# Patient Record
Sex: Male | Born: 1980 | Race: White | Hispanic: No | Marital: Single | Smoking: Former smoker
Health system: Southern US, Community
[De-identification: ages and names within clinical notes are randomized; demographics above are authoritative.]

## PROBLEM LIST (undated history)

## (undated) DIAGNOSIS — C801 Malignant (primary) neoplasm, unspecified: Secondary | ICD-10-CM

## (undated) HISTORY — PX: SURGERY SCROTAL / TESTICULAR: SUR1316

---

## 2008-12-02 ENCOUNTER — Emergency Department (HOSPITAL_BASED_OUTPATIENT_CLINIC_OR_DEPARTMENT_OTHER): Admission: EM | Admit: 2008-12-02 | Discharge: 2008-12-02 | Payer: Self-pay | Admitting: Emergency Medicine

## 2008-12-02 ENCOUNTER — Emergency Department (HOSPITAL_COMMUNITY): Admission: EM | Admit: 2008-12-02 | Discharge: 2008-12-02 | Payer: Self-pay | Admitting: Emergency Medicine

## 2008-12-04 ENCOUNTER — Ambulatory Visit (HOSPITAL_COMMUNITY): Admission: RE | Admit: 2008-12-04 | Discharge: 2008-12-04 | Payer: Self-pay | Admitting: Urology

## 2008-12-07 ENCOUNTER — Encounter: Payer: Self-pay | Admitting: Urology

## 2008-12-07 ENCOUNTER — Ambulatory Visit (HOSPITAL_COMMUNITY): Admission: RE | Admit: 2008-12-07 | Discharge: 2008-12-07 | Payer: Self-pay | Admitting: Urology

## 2008-12-12 ENCOUNTER — Ambulatory Visit (HOSPITAL_COMMUNITY): Admission: RE | Admit: 2008-12-12 | Discharge: 2008-12-12 | Payer: Self-pay | Admitting: Urology

## 2008-12-21 ENCOUNTER — Ambulatory Visit: Admission: RE | Admit: 2008-12-21 | Discharge: 2009-02-09 | Payer: Self-pay | Admitting: Radiation Oncology

## 2009-03-02 ENCOUNTER — Ambulatory Visit (HOSPITAL_COMMUNITY): Admission: RE | Admit: 2009-03-02 | Discharge: 2009-03-02 | Payer: Self-pay | Admitting: Urology

## 2010-02-13 ENCOUNTER — Ambulatory Visit (HOSPITAL_COMMUNITY)
Admission: RE | Admit: 2010-02-13 | Discharge: 2010-02-13 | Payer: Self-pay | Source: Home / Self Care | Attending: Urology | Admitting: Urology

## 2010-03-08 ENCOUNTER — Ambulatory Visit (HOSPITAL_COMMUNITY): Admission: RE | Admit: 2010-03-08 | Payer: Self-pay | Source: Home / Self Care | Admitting: Radiation Oncology

## 2010-04-03 ENCOUNTER — Encounter: Payer: Self-pay | Admitting: Radiation Oncology

## 2010-06-06 LAB — URINALYSIS, ROUTINE W REFLEX MICROSCOPIC
Bilirubin Urine: NEGATIVE
Ketones, ur: NEGATIVE mg/dL

## 2010-06-06 LAB — GC/CHLAMYDIA PROBE AMP, GENITAL
Chlamydia, DNA Probe: NEGATIVE
GC Probe Amp, Genital: NEGATIVE

## 2014-06-03 ENCOUNTER — Emergency Department (HOSPITAL_COMMUNITY): Payer: Self-pay

## 2014-06-03 ENCOUNTER — Encounter (HOSPITAL_COMMUNITY): Payer: Self-pay | Admitting: Nurse Practitioner

## 2014-06-03 ENCOUNTER — Emergency Department (HOSPITAL_COMMUNITY)
Admission: EM | Admit: 2014-06-03 | Discharge: 2014-06-03 | Disposition: A | Discharge status: Home / Self Care | Payer: Self-pay | Attending: Emergency Medicine | Admitting: Emergency Medicine

## 2014-06-03 DIAGNOSIS — T148XXA Other injury of unspecified body region, initial encounter: Secondary | ICD-10-CM

## 2014-06-03 DIAGNOSIS — Z720 Tobacco use: Secondary | ICD-10-CM | POA: Insufficient documentation

## 2014-06-03 DIAGNOSIS — W2201XA Walked into wall, initial encounter: Secondary | ICD-10-CM | POA: Insufficient documentation

## 2014-06-03 DIAGNOSIS — Z859 Personal history of malignant neoplasm, unspecified: Secondary | ICD-10-CM | POA: Insufficient documentation

## 2014-06-03 DIAGNOSIS — Y9389 Activity, other specified: Secondary | ICD-10-CM | POA: Insufficient documentation

## 2014-06-03 DIAGNOSIS — Y998 Other external cause status: Secondary | ICD-10-CM | POA: Insufficient documentation

## 2014-06-03 DIAGNOSIS — Y9289 Other specified places as the place of occurrence of the external cause: Secondary | ICD-10-CM | POA: Insufficient documentation

## 2014-06-03 DIAGNOSIS — S62142A Displaced fracture of body of hamate [unciform] bone, left wrist, initial encounter for closed fracture: Secondary | ICD-10-CM | POA: Insufficient documentation

## 2014-06-03 HISTORY — DX: Malignant (primary) neoplasm, unspecified: C80.1

## 2014-06-03 MED ORDER — HYDROCODONE-ACETAMINOPHEN 5-325 MG PO TABS
1.0000 | ORAL_TABLET | Freq: Once | ORAL | Status: AC
  Administered 2014-06-03: 1 via ORAL
  Filled 2014-06-03: qty 1

## 2014-06-03 MED ORDER — HYDROCODONE-ACETAMINOPHEN 5-325 MG PO TABS: 1.0000 | ORAL_TABLET | ORAL | Status: DC | PRN

## 2014-06-03 MED ORDER — IBUPROFEN 800 MG PO TABS: 800.0000 mg | ORAL_TABLET | Freq: Three times a day (TID) | ORAL | Status: DC | PRN

## 2014-06-03 NOTE — Discharge Instructions (Signed)
Read the information below.  Use the prescribed medication as directed.  Please discuss all new medications with your pharmacist.  Do not take additional tylenol while taking the prescribed pain medication to avoid overdose.  You may return to the Emergency Department at any time for worsening condition or any new symptoms that concern you.  If you develop uncontrolled pain, weakness or numbness of the extremity, severe discoloration of the skin, or you are unable to move your fingers, return to the ER for a recheck.      Wrist Fracture A wrist fracture is a break or crack in one of the bones of your wrist. Your wrist is made up of eight small bones at the palm of your hand (carpal bones) and two long bones that make up your forearm (radius and ulna).  CAUSES   A direct blow to the wrist.  Falling on an outstretched hand.  Trauma, such as a car accident or a fall. RISK FACTORS Risk factors for wrist fracture include:   Participating in contact and high-risk sports, such as skiing, biking, and ice skating.  Taking steroid medicines.  Smoking.  Being male.  Being Caucasian.  Drinking more than three alcoholic beverages per day.  Having low or lowered bone density (osteoporosis or osteopenia).  Age. Older adults have decreased bone density.  Women who have had menopause.  History of previous fractures. SIGNS AND SYMPTOMS Symptoms of wrist fractures include tenderness, bruising, and inflammation. Additionally, the wrist may hang in an odd position or appear deformed.  DIAGNOSIS Diagnosis may include:  Physical exam.  X-ray. TREATMENT Treatment depends on many factors, including the nature and location of the fracture, your age, and your activity level. Treatment for wrist fracture can be nonsurgical or surgical.  Nonsurgical Treatment A plaster cast or splint may be applied to your wrist if the bone is in a good position. If the fracture is not in good position, it may be  necessary for your health care provider to realign it before applying a splint or cast. Usually, a cast or splint will be worn for several weeks.  Surgical Treatment Sometimes the position of the bone is so far out of place that surgery is required to apply a device to hold it together as it heals. Depending on the fracture, there are a number of options for holding the bone in place while it heals, such as a cast and metal pins.  HOME CARE INSTRUCTIONS  Keep your injured wrist elevated and move your fingers as much as possible.  Do not put pressure on any part of your cast or splint. It may break.   Use a plastic bag to protect your cast or splint from water while bathing or showering. Do not lower your cast or splint into water.  Take medicines only as directed by your health care provider.  Keep your cast or splint clean and dry. If it becomes wet, damaged, or suddenly feels too tight, contact your health care provider right away.  Do not use any tobacco products including cigarettes, chewing tobacco, or electronic cigarettes. Tobacco can delay bone healing. If you need help quitting, ask your health care provider.  Keep all follow-up visits as directed by your health care provider. This is important.  Ask your health care provider if you should take supplements of calcium and vitamins C and D to promote bone healing. SEEK MEDICAL CARE IF:   Your cast or splint is damaged, breaks, or gets wet.  You  have a fever.  You have chills.  You have continued severe pain or more swelling than you did before the cast was put on. SEEK IMMEDIATE MEDICAL CARE IF:   Your hand or fingernails on the injured arm turn blue or gray, or feel cold or numb.  You have decreased feeling in the fingers of your injured arm. MAKE SURE YOU:  Understand these instructions.  Will watch your condition.  Will get help right away if you are not doing well or get worse. Document Released: 11/27/2004  Document Revised: 07/04/2013 Document Reviewed: 03/07/2011 [facility] Patient Information 2015 [place], [place]. This information is not intended to replace advice given to you by your health care provider. Make sure you discuss any questions you have with your health care provider.  Splint Care Splints protect and rest injuries. Splints can be made of plaster, fiberglass, or metal. They are used to treat broken bones, sprains, tendonitis, and other injuries. HOME CARE  Keep the injured area raised (elevated) while sitting or lying down. Keep the injured body part just above the level of the heart. This will decrease puffiness (swelling) and pain.  If an elastic bandage was used to hold the splint, it can be loosened. Only loosen it to make room for puffiness and to ease pain.  Keep the splint clean and dry.  Do not scratch the skin under the splint with sharp or pointed objects.  Follow up with your doctor as told. GET HELP RIGHT AWAY IF:   There is more pain or pressure around the injury.  There is numbness, tingling, or pain in the toes or fingers past the injury.  The fingers or toes become cold or blue.  The splint becomes too soft or breaks before the injury is healed. MAKE SURE YOU:   Understand these instructions. Hamate (Hook) Fracture The hamate is a bone in the hand that has the "hamate hook," a bony bump (prominence), that is vulnerable to injury. A hamate fracture is a complete or incomplete break of this bone. SYMPTOMS  Pain or soreness on the little finger side (ulnar) of the wrist, on the palm side, or sometimes on the back side. Pain, tenderness, swelling, and occasionally bruising around the fracture site, at the base of the wrist. Pain with gripping or swinging a golf club, bat, or racquet. Numbness and coldness in the hand. Swelling in the hand, causing pressure on the blood vessels or nerves. CAUSES  A hamate fracture is usually the result of direct hit (trauma)  to the base of the palm from a grasped object, such as the end of a golf club, baseball bat, or racquet. RISK INCREASES WITH: Sports in which a bat (baseball, cricket), club (golf), or racquet (tennis, racquetball) are used. History of bone or joint disease, including osteoporosis, or previous hand restraint. PREVENTION Maintain physical fitness: Hand and forearm strength. Flexibility and endurance. When playing risky sports, wear appropriately fitted and padded protective equipment, such as padded gloves, or use clubs, racquets, and bats with padded grips. Learn and use proper technique when hitting or swinging a bat, racquet, or club. If you had a previous injury, use tape or padding to protect your hand before playing contact or jumping sports. PROGNOSIS  Hamate fractures may heal with restraint. However, the function of blood vessels in the area causes hamate injuries to have a decreased ability to heal. Surgery may be performed to remove the broken piece, with a return to sports after 6 to 10 weeks.  RELATED COMPLICATIONS  Fracture does not heal (nonunion), common. Fracture heals in a poor position (malunion). Impaired blood supply to the fracture and bones. Chronic pain, stiffness, or swelling of the hand and wrist, especially with prolonged casting. Nerve injury to the hand, causing pain, numbness, and weakness or clumsiness in the hand and fingers. Excessive bleeding in the hand, causing pressure and injury to nerves and blood vessels (rare). Tearing (rupture) of the tendons that bend the wrist or fingers. Risks of surgery: infection, bleeding, injury to nerves (numbness, weakness), nonunion, malunion, arthritis, stiffness, and pain. TREATMENT Verne Grain that are in proper alignment (non-displacedfracture), treatment first involves ice, medicine, and elevation to reduce pain and inflammation. Restraint of the hand and wrist is advised for 6 to 8 weeks, to allow the bones to heal. If the  fractured bones are out of alignment (displaced), surgery is advised to either remove the broken piece or fix it with screws and pins. After surgery, the injury is restrained. After restraint (with or without surgery), stretching and strengthening exercises for the injured and weakened joint and muscles are needed. Exercises may be completed at home or with a therapist. Depending on the sport and the position played, a brace or splint may be advised at first, when returning to sports.  MEDICATION  If pain medicine is needed, nonsteroidal anti-inflammatory medicines (aspirin and ibuprofen), or other minor pain relievers (acetaminophen), are often advised. Do not take pain medicine for 7 days before surgery. Prescription pain relievers are usually prescribed only after surgery. Use only as directed and only as much as you need. COLD THERAPY  Cold treatment (icing) relieves pain and reduces inflammation. Cold treatment should be applied for 10 to 15 minutes every 2 to 3 hours, and immediately after activity that aggravates your symptoms. Use ice packs or an ice massage. SEEK MEDICAL CARE IF:  Pain, tenderness, or swelling gets worse, despite treatment. You experience pain, numbness, or coldness in the hand. Blue, gray, or dark color appears in the fingernails. Any of the following occur after surgery: fever, increased pain, swelling, redness, drainage of fluids, or bleeding in the affected area. New, unexplained symptoms develop. (Drugs used in treatment may produce side effects.) Document Released: 02/17/2005 Document Revised: 05/12/2011 Document Reviewed: 06/01/2008 [facility] Patient Information 2015 [place], [place]. This information is not intended to replace advice given to you by your health care provider. Make sure you discuss any questions you have with your health care provider.   Will watch this condition.  Will get help right away if you are not doing well or get worse. Document Released:  11/27/2007 Document Revised: 05/12/2011 Document Reviewed: 11/27/2007 [facility] Patient Information 2015 [place], [place]. This information is not intended to replace advice given to you by your health care provider. Make sure you discuss any questions you have with your health care provider.

## 2014-06-03 NOTE — Progress Notes (Signed)
Orthopedic Tech Progress Note Patient Details:  Joshua Gregory 1980/05/11 [id number]  Ortho Devices Type of Ortho Device: Ace wrap, Ulna gutter splint Ortho Device/Splint Location: lue Ortho Device/Splint Interventions: Application   Drevon Plog 06/03/2014, 2:35 PM

## 2014-06-03 NOTE — ED Provider Notes (Signed)
CSN: [id number]     Arrival date & time 06/03/14  1230 History  This chart is scribed for non-physician practitioner, Clayton Bibles, PA-C, working with Debby Freiberg, MD by Chester Holstein, ED Scribe.  This patient was seen in room TR04C/TR04C and the patient's care was started 1:34 PM.      Chief Complaint  Patient presents with  . Wrist Pain     Patient is a 34 y.o. male presenting with wrist pain. The history is provided by the patient. No language interpreter was used.  Wrist Pain   HPI Comments: Cristal Howatt is a 34 y.o. male who presents to the Emergency Department complaining of left hand / wrist pain with onset last night. Pt states he was playing around with friends and punched a wall (exterior house wall). Pt notes moving thumb and turning hand aggravates the pain. Pt notes associated swelling in forearm which has resolved. Pt took ibuprofen 800mg  and icing for relief. Pt is right handed. Pt is unsure of tetanus status. Pt denies any other injury, numbness, and tingling.    Past Medical History  Diagnosis Date  . Cancer    Past Surgical History  Procedure Laterality Date  . Surgery scrotal / testicular     History reviewed. No pertinent family history. History  Substance Use Topics  . Smoking status: Current Every Day Smoker    Types: Cigarettes  . Smokeless tobacco: Not on file  . Alcohol Use: Yes    Review of Systems  Constitutional: Negative for fever.  Musculoskeletal: Positive for myalgias and arthralgias. Negative for back pain, joint swelling and neck pain.  Skin: Positive for wound. Negative for color change.  Allergic/Immunologic: Negative for immunocompromised state.  Neurological: Negative for weakness and numbness.  Hematological: Does not bruise/bleed easily.  Psychiatric/Behavioral: Negative for self-injury (accidental).      Allergies  Shellfish allergy  Home Medications   Prior to Admission medications   Not on File   BP 134/88 mmHg   Pulse 79  Temp(Src) 98 F (36.7 C) (Oral)  Resp 18  SpO2 96% Physical Exam  Constitutional: He appears well-developed and well-nourished. No distress.  HENT:  Head: Normocephalic and atraumatic.  Neck: Neck supple.  Pulmonary/Chest: Effort normal.  Musculoskeletal:  Focal tenderness to radial aspect of wrist diffusely throughout dorsal aspect of hand mostly over 3rd metacarpal; sensation intact, distal pulses intact.     Neurological: He is alert.  Skin: He is not diaphoretic.  Nursing note and vitals reviewed.   ED Course  Procedures (including critical care time) DIAGNOSTIC STUDIES: Oxygen Saturation is 96% on room air, normal by my interpretation.    COORDINATION OF CARE: 1:38 PM Discussed treatment plan with patient at beside, the patient agrees with the plan and has no further questions at this time.   Labs Review Labs Reviewed - No data to display  Imaging Review Dg Wrist Complete Left  06/03/2014   CLINICAL DATA:  Punched a wall. Left wrist injury, pain, and swelling. Initial encounter.  EXAM: LEFT WRIST - COMPLETE 3+ VIEW  COMPARISON:  None.  FINDINGS: A tiny ossific density is seen along the dorsal aspect of the hamate on the oblique projection, suspicious for a tiny avulsion fracture fragment. No other fractures are identified and there is no evidence of dislocation. No other bone lesions or arthropathy identified.  IMPRESSION: Suspect tiny avulsion fracture fragment along the dorsal aspect of the hamate. Recommend clinical correlation for point tenderness at this site.   Electronically Signed  By: Earle Gell M.D.   On: 06/03/2014 13:45   Dg Hand Complete Left  06/03/2014   CLINICAL DATA:  Punched a wall last night while drinking. Pain and swelling on the posterior side of left hand.  EXAM: LEFT HAND - COMPLETE 3+ VIEW  COMPARISON:  None.  FINDINGS: Subtle deformity involving the fifth metacarpal head/ neck region. Suspected to represent remote trauma. No acute fracture or  dislocation.  IMPRESSION: No acute osseous abnormality. Suspect remote trauma involving the distal fifth metacarpal.   Electronically Signed   By: Abigail Miyamoto M.D.   On: 06/03/2014 13:45     EKG Interpretation None     1:37 PM Pt declines tetanus vaccination. MDM   Final diagnoses:  Avulsion fracture  Closed hamate fracture, left, initial encounter    Afebrile, nontoxic patient with injury to his left wrist while punching a wall.   Xray shows avulsion fracture of hamate.  Pt is tender over this area.  Neurovascularly intact.   D/C home with splint, norco, ibuprofen, hand surgery follow up Fredna Dow).  Discussed result, findings, treatment, and follow up  with patient.  Pt given return precautions.  Pt verbalizes understanding and agrees with plan.      I personally performed the services described in this documentation, which was scribed in my presence. The recorded information has been reviewed and is accurate.     Clayton Bibles, PA-C 06/03/14 1535  Debby Freiberg, MD 06/06/14 [id number]

## 2014-06-03 NOTE — ED Notes (Addendum)
He was horseplaying with friends last night and punched something. hes had L wrist and hand pain since. Bruising and swelling noted to L posterior hand. Cms intact. Pt took 800mg  ibuprofen this am with some relief

## 2017-02-19 IMAGING — CR DG HAND COMPLETE 3+V*L*
3 series · 3 of 3 positions shown · non-contrast
Comparison: None.

CLINICAL DATA: Punched a wall last night while drinking. Pain and
swelling on the posterior side of left hand.

EXAM:
LEFT HAND - COMPLETE 3+ VIEW

[hand pa]
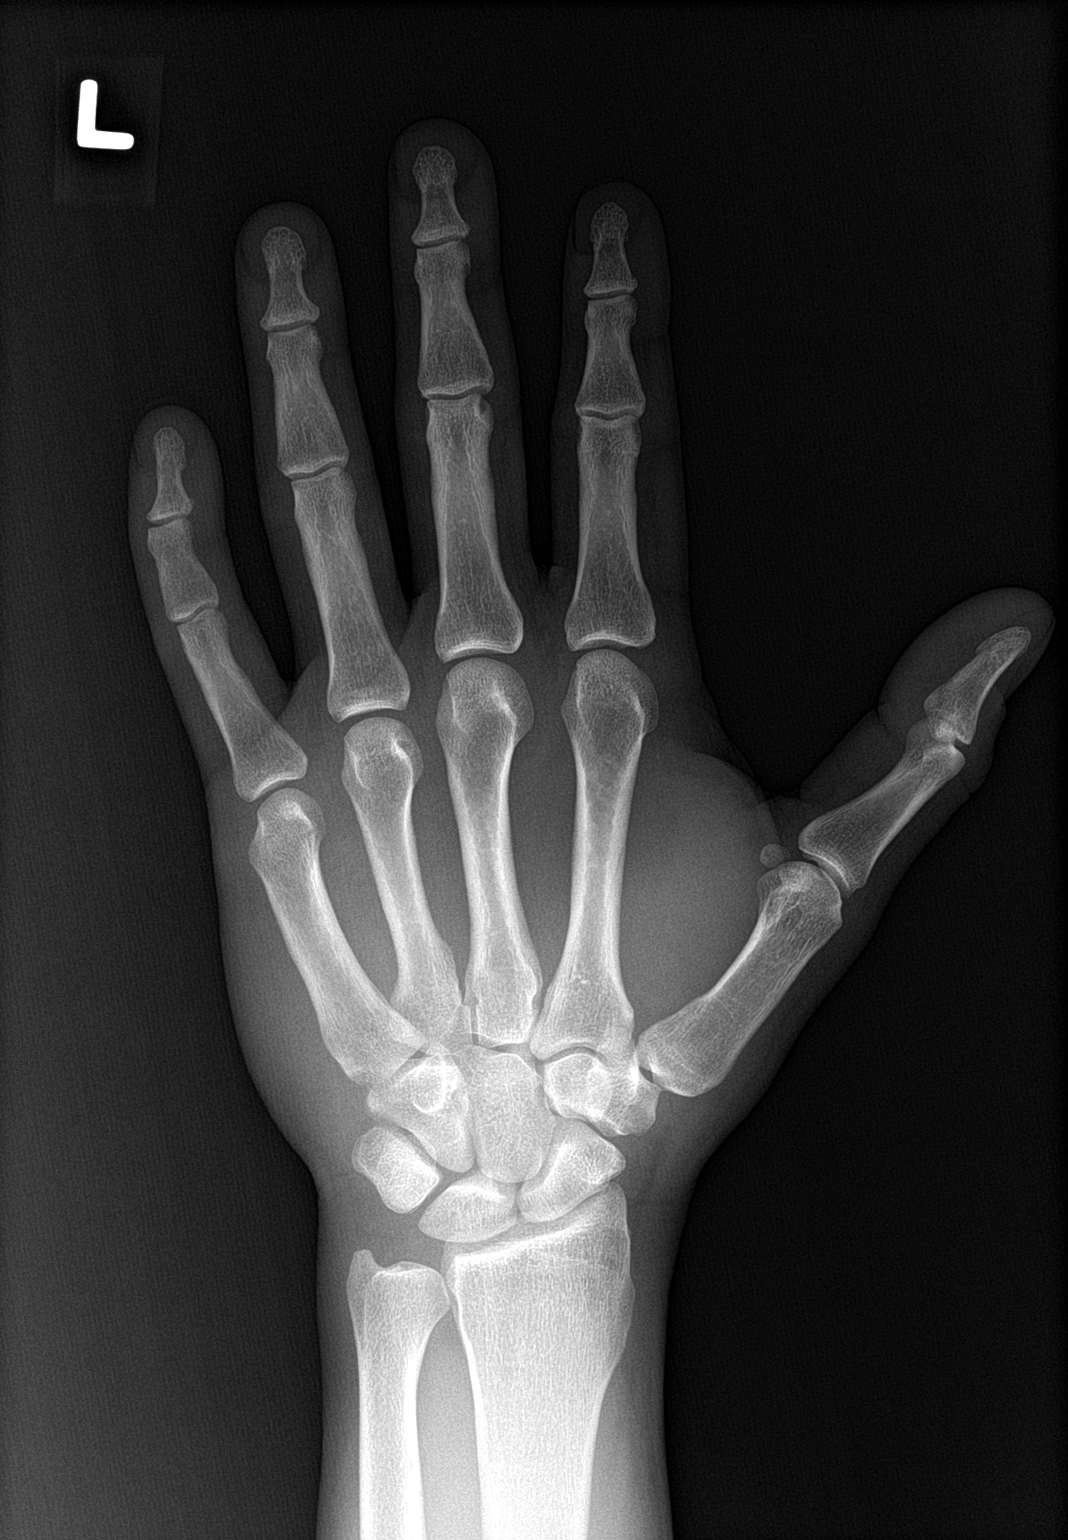

[hand obl]
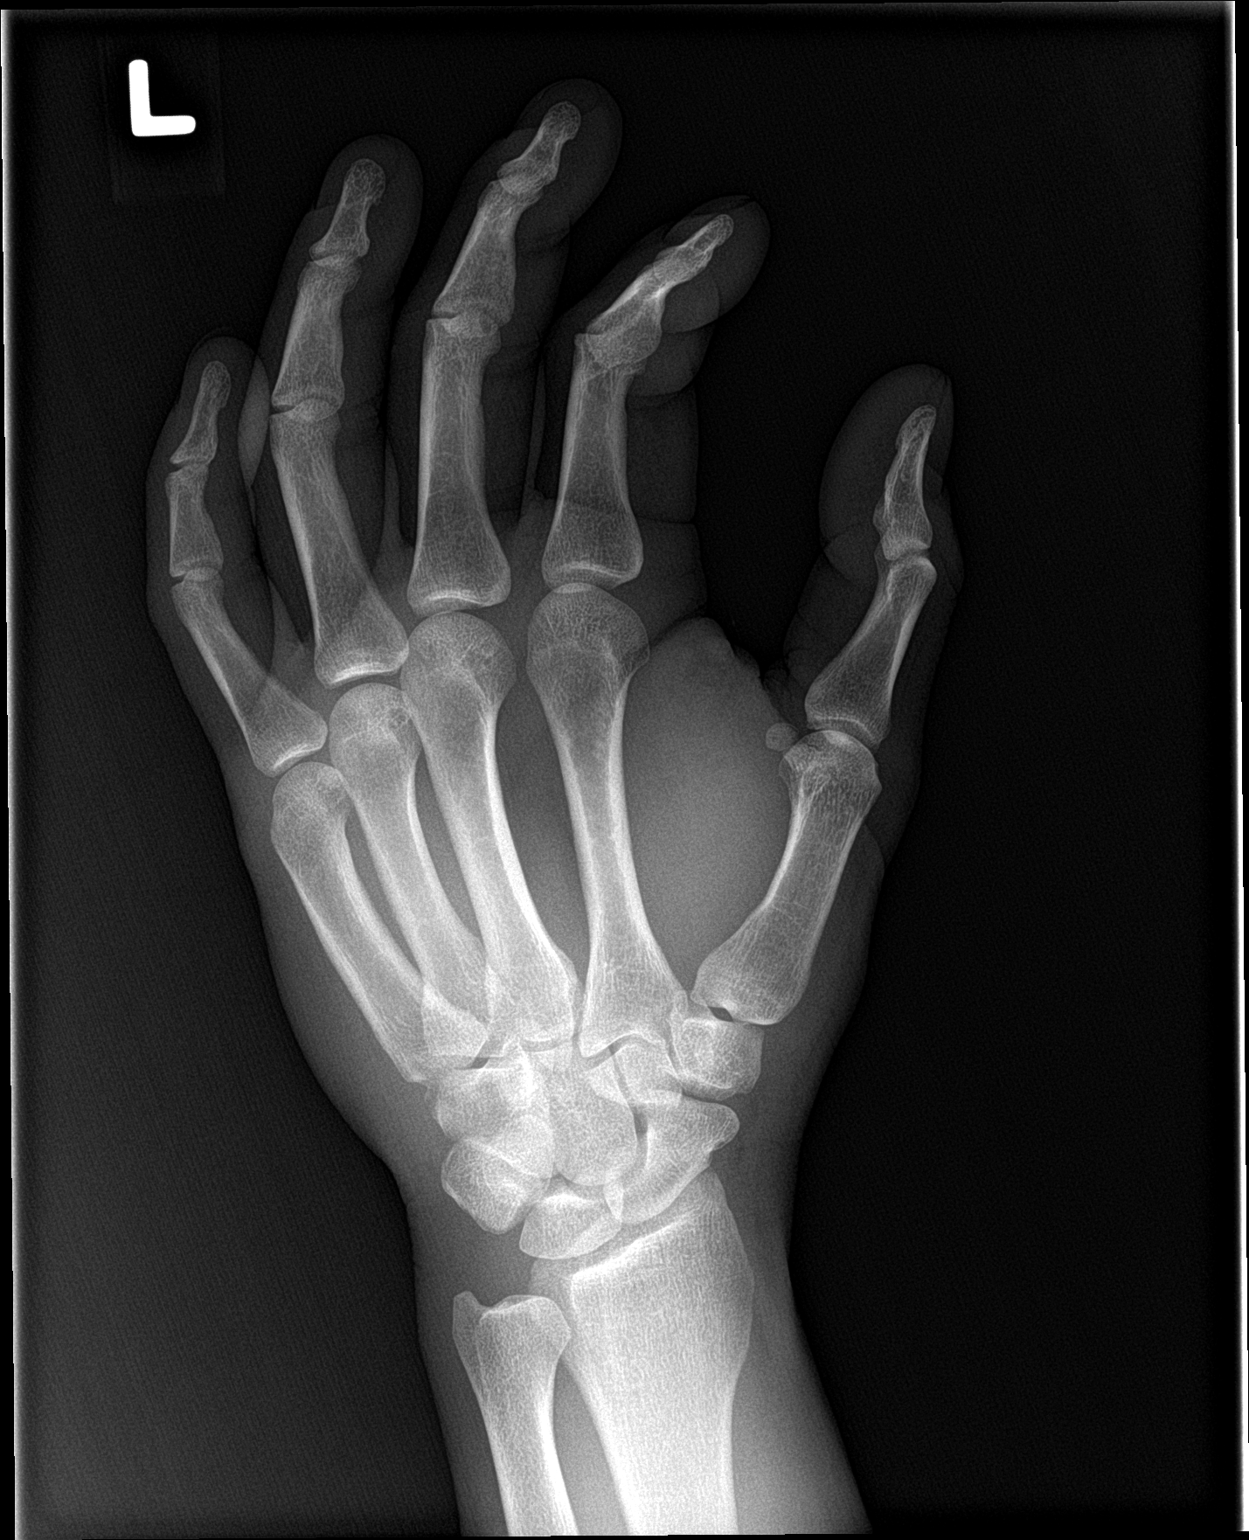

[hand lat]
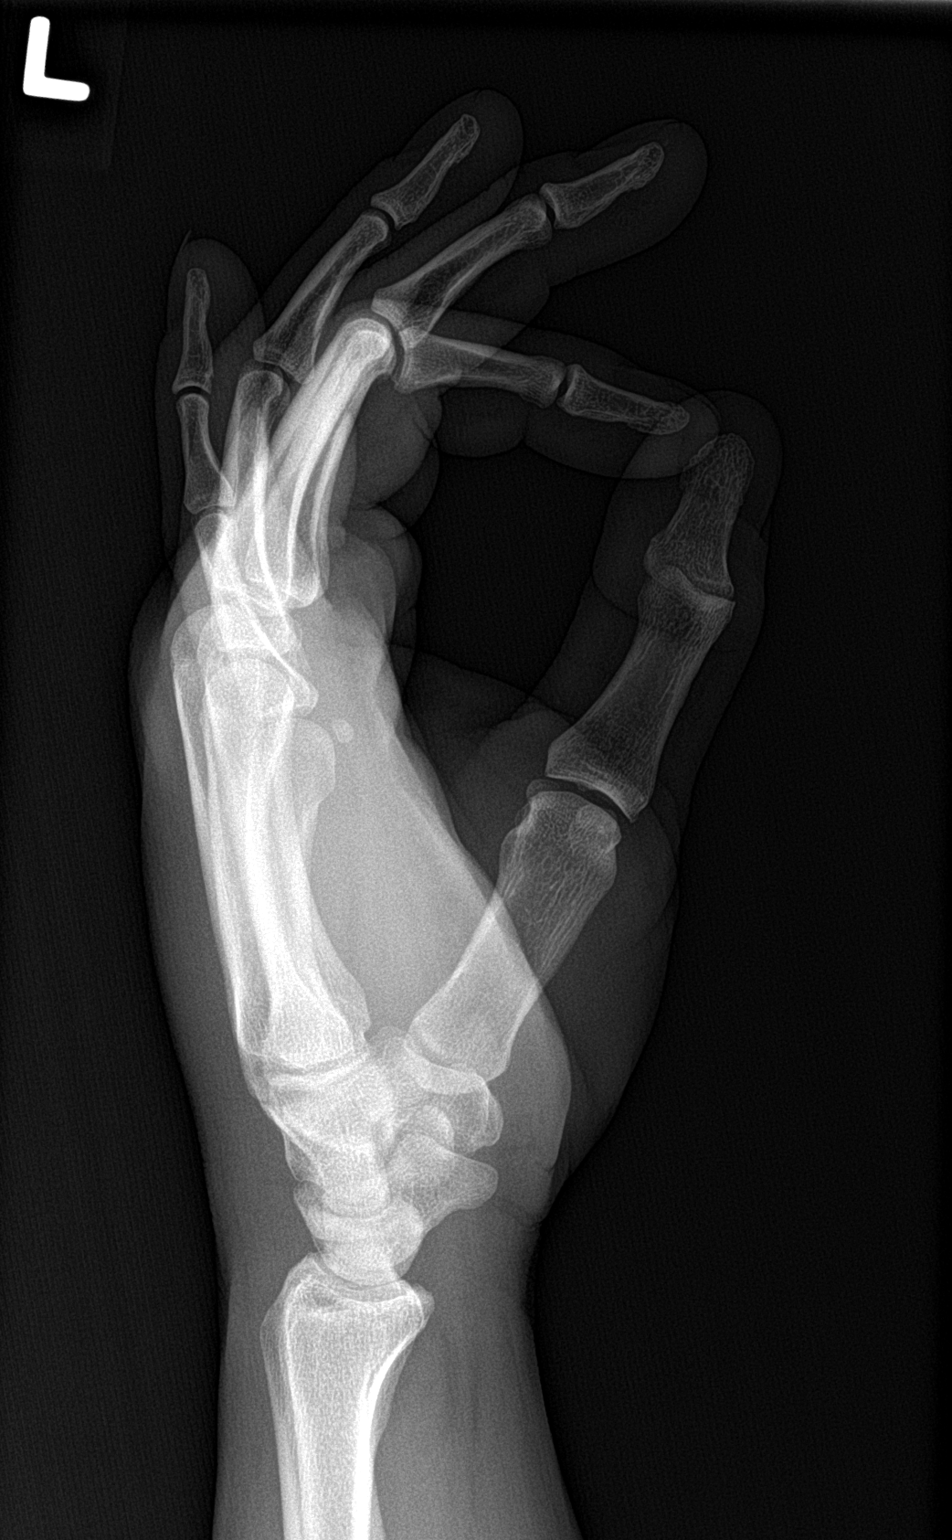

[3 of 3 positions shown; findings below may reference images not displayed]

FINDINGS: Subtle deformity involving the fifth metacarpal head/ neck region.
Suspected to represent remote trauma. No acute fracture or
dislocation.
IMPRESSION: No acute osseous abnormality. Suspect remote trauma involving the
distal fifth metacarpal.

## 2017-02-19 IMAGING — CR DG WRIST COMPLETE 3+V*L*
4 series · 4 of 4 positions shown · non-contrast
Comparison: None.

CLINICAL DATA: Punched a wall. Left wrist injury, pain, and
swelling. Initial encounter.

EXAM:
LEFT WRIST - COMPLETE 3+ VIEW

[wrist pa]
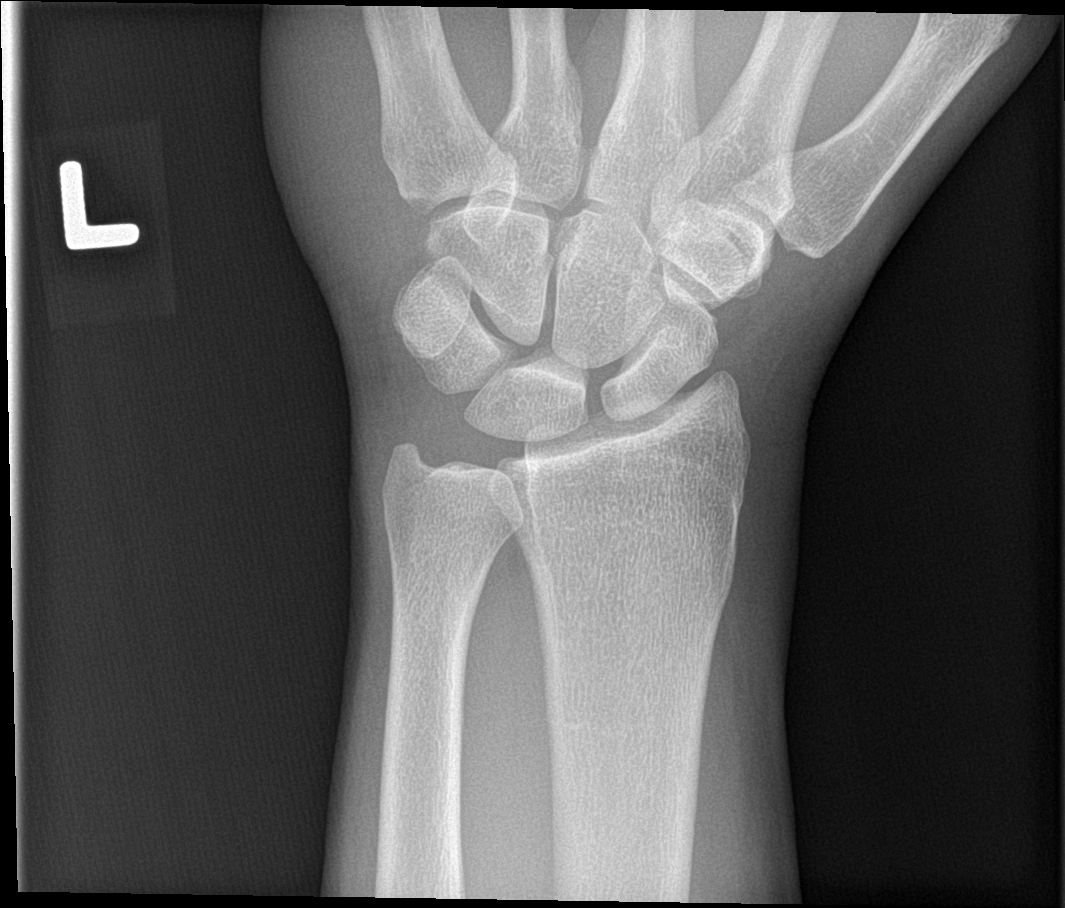

[wrist obl]
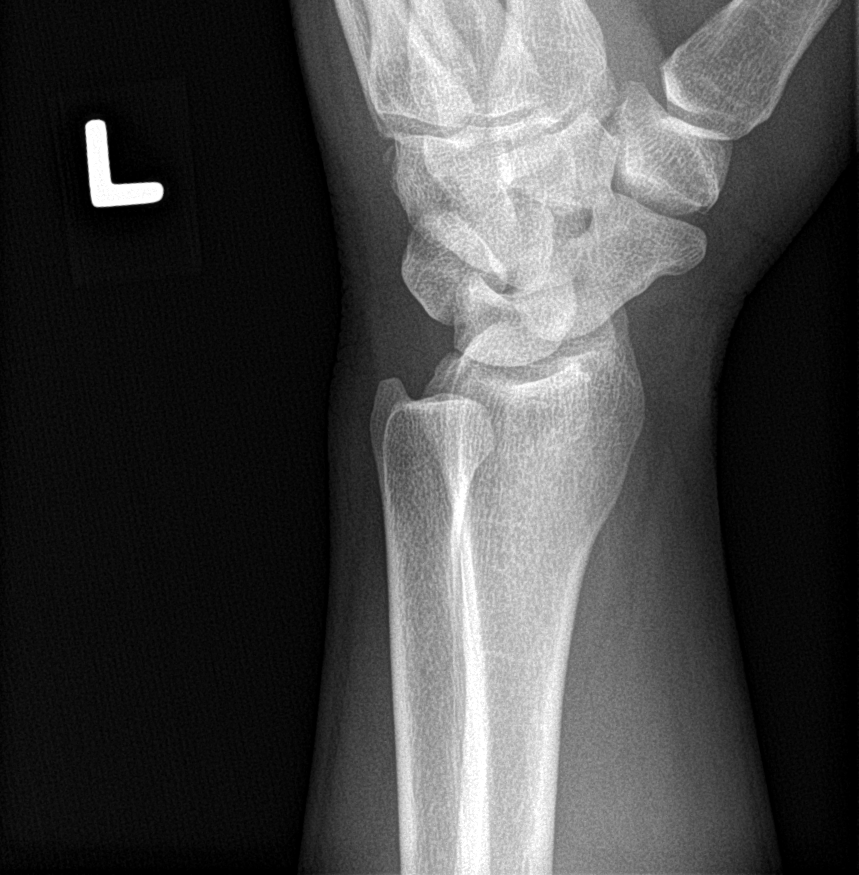

[wrist lat]
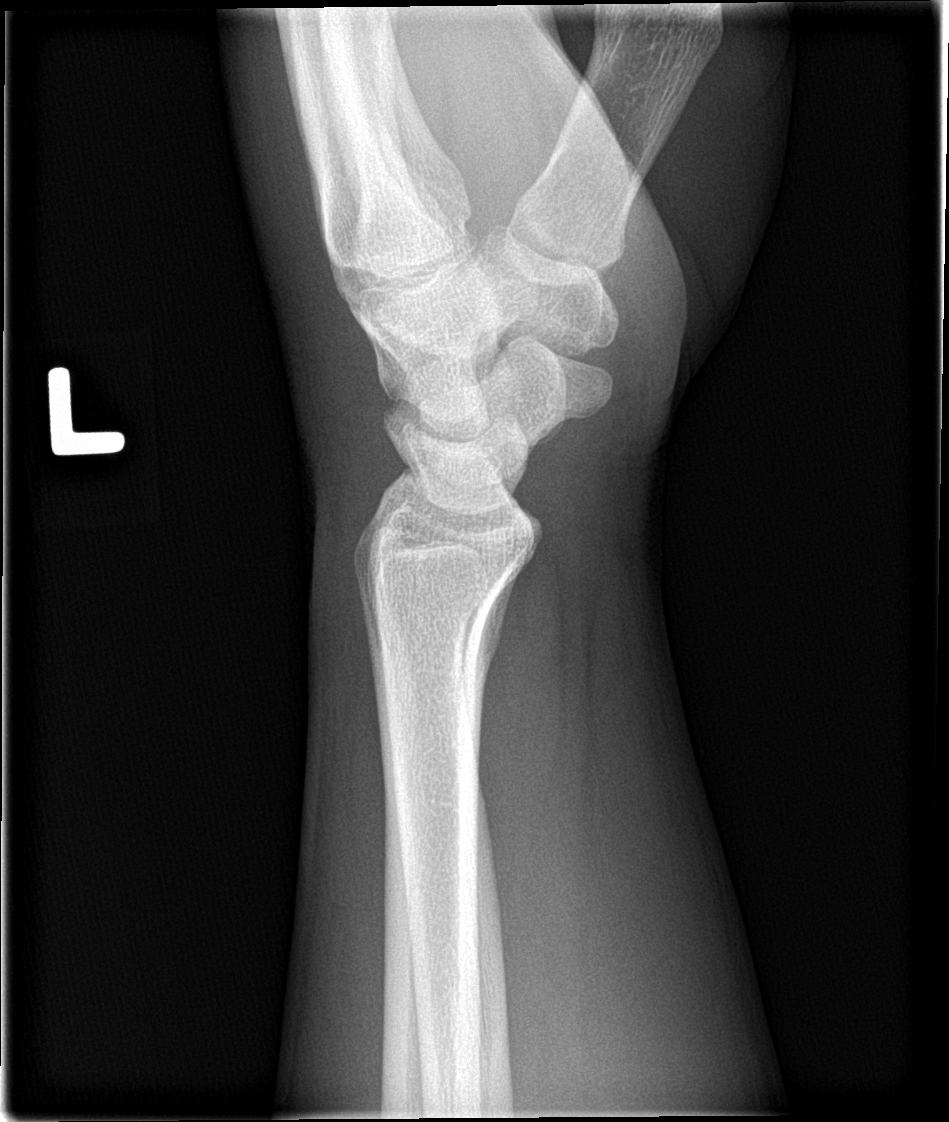

[wrist navicular]
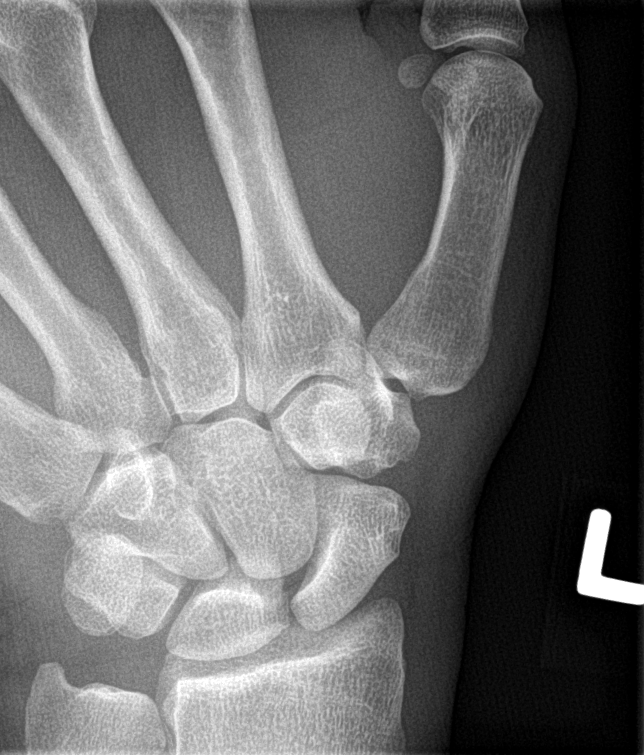

[4 of 4 positions shown; findings below may reference images not displayed]

FINDINGS: A tiny ossific density is seen along the dorsal aspect of the hamate
on the oblique projection, suspicious for a tiny avulsion fracture
fragment. No other fractures are identified and there is no evidence
of dislocation. No other bone lesions or arthropathy identified.
IMPRESSION: Suspect tiny avulsion fracture fragment along the dorsal aspect of
the hamate. Recommend clinical correlation for point tenderness at
this site.

## 2019-11-16 ENCOUNTER — Other Ambulatory Visit: Payer: Self-pay

## 2019-11-17 ENCOUNTER — Ambulatory Visit (INDEPENDENT_AMBULATORY_CARE_PROVIDER_SITE_OTHER): Payer: Managed Care, Other (non HMO) | Admitting: Family Medicine

## 2019-11-17 ENCOUNTER — Encounter: Payer: Self-pay | Admitting: Family Medicine

## 2019-11-17 VITALS — BP 132/78 | HR 58 | Temp 97.6°F | Ht 66.0 in | Wt 164.6 lb

## 2019-11-17 DIAGNOSIS — R5383 Other fatigue: Secondary | ICD-10-CM

## 2019-11-17 DIAGNOSIS — F341 Dysthymic disorder: Secondary | ICD-10-CM

## 2019-11-17 DIAGNOSIS — Z0000 Encounter for general adult medical examination without abnormal findings: Secondary | ICD-10-CM | POA: Diagnosis not present

## 2019-11-17 DIAGNOSIS — Z8547 Personal history of malignant neoplasm of testis: Secondary | ICD-10-CM

## 2019-11-17 LAB — COMPREHENSIVE METABOLIC PANEL
ALT: 26 U/L (ref 0–53)
AST: 19 U/L (ref 0–37)
Albumin: 4.7 g/dL (ref 3.5–5.2)
Alkaline Phosphatase: 129 U/L — ABNORMAL HIGH (ref 39–117)
BUN: 14 mg/dL (ref 6–23)
CO2: 29 mEq/L (ref 19–32)
Calcium: 10.1 mg/dL (ref 8.4–10.5)
Chloride: 100 mEq/L (ref 96–112)
Creatinine, Ser: 1.27 mg/dL (ref 0.40–1.50)
GFR: 63 mL/min (ref >60.00)
Glucose, Bld: 87 mg/dL (ref 70–99)
Potassium: 5.1 mEq/L (ref 3.5–5.1)
Sodium: 136 mEq/L (ref 135–145)
Total Bilirubin: 0.7 mg/dL (ref 0.2–1.2)
Total Protein: 7.4 g/dL (ref 6.0–8.3)

## 2019-11-17 LAB — LIPID PANEL
Cholesterol: 198 mg/dL (ref 0–200)
HDL: 42.8 mg/dL (ref >39.00)
NonHDL: 155.33
Total CHOL/HDL Ratio: 5
Triglycerides: 246 mg/dL — ABNORMAL HIGH (ref 0.0–149.0)
VLDL: 49.2 mg/dL — ABNORMAL HIGH (ref 0.0–40.0)

## 2019-11-17 LAB — CBC
HCT: 46.8 % (ref 39.0–52.0)
Hemoglobin: 15.8 g/dL (ref 13.0–17.0)
MCHC: 33.7 g/dL (ref 30.0–36.0)
MCV: 86.8 fl (ref 78.0–100.0)
Platelets: 239 10*3/uL (ref 150.0–400.0)
RBC: 5.39 Mil/uL (ref 4.22–5.81)
RDW: 14.3 % (ref 11.5–15.5)
WBC: 8.4 10*3/uL (ref 4.0–10.5)

## 2019-11-17 LAB — URINALYSIS, ROUTINE W REFLEX MICROSCOPIC
Bilirubin Urine: NEGATIVE
Hgb urine dipstick: NEGATIVE
Ketones, ur: NEGATIVE
Leukocytes,Ua: NEGATIVE
Nitrite: NEGATIVE
RBC / HPF: NONE SEEN (ref 0–?)
Specific Gravity, Urine: 1.015 (ref 1.000–1.030)
Total Protein, Urine: NEGATIVE
Urine Glucose: NEGATIVE
Urobilinogen, UA: 0.2 (ref 0.0–1.0)
WBC, UA: NONE SEEN (ref 0–?)
pH: 6 (ref 5.0–8.0)

## 2019-11-17 LAB — LDL CHOLESTEROL, DIRECT: Direct LDL: 77 mg/dL

## 2019-11-17 LAB — TESTOSTERONE: Testosterone: 129.53 ng/dL — ABNORMAL LOW (ref 300.00–890.00)

## 2019-11-17 LAB — TSH: TSH: 0.88 u[IU]/mL (ref 0.35–4.50)

## 2019-11-17 NOTE — Patient Instructions (Signed)
Fatigue If you have fatigue, you feel tired all the time and have a lack of energy or a lack of motivation. Fatigue may make it difficult to start or complete tasks because of exhaustion. In general, occasional or mild fatigue is often a normal response to activity or life. However, long-lasting (chronic) or extreme fatigue may be a symptom of a medical condition. Follow these instructions at home: General instructions  Watch your fatigue for any changes.  Go to bed and get up at the same time every day.  Avoid fatigue by pacing yourself during the day and getting enough sleep at night.  Maintain a healthy weight. Medicines  Take over-the-counter and prescription medicines only as told by your health care provider.  Take a multivitamin, if told by your health care provider.  Do not use herbal or dietary supplements unless they are approved by your health care provider. Activity   Exercise regularly, as told by your health care provider.  Use or practice techniques to help you relax, such as yoga, tai chi, meditation, or massage therapy. Eating and drinking   Avoid heavy meals in the evening.  Eat a well-balanced diet, which includes lean proteins, whole grains, plenty of fruits and vegetables, and low-fat dairy products.  Avoid consuming too much caffeine.  Avoid the use of alcohol.  Drink enough fluid to keep your urine pale yellow. Lifestyle  Change situations that cause you stress. Try to keep your work and personal schedule in balance.  Do not use any products that contain nicotine or tobacco, such as cigarettes and e-cigarettes. If you need help quitting, ask your health care provider.  Do not use drugs. Contact a health care provider if:  Your fatigue does not get better.  You have a fever.  You suddenly lose or gain weight.  You have headaches.  You have trouble falling asleep or sleeping through the night.  You feel angry, guilty, anxious, or  sad.  You are unable to have a bowel movement (constipation).  Your skin is dry.  You have swelling in your legs or another part of your body. Get help right away if:  You feel confused.  Your vision is blurry.  You feel faint or you pass out.  You have a severe headache.  You have severe pain in your abdomen, your back, or the area between your waist and hips (pelvis).  You have chest pain, shortness of breath, or an irregular or fast heartbeat.  You are unable to urinate, or you urinate less than normal.  You have abnormal bleeding, such as bleeding from the rectum, vagina, nose, lungs, or nipples.  You vomit blood.  You have thoughts about hurting yourself or others. If you ever feel like you may hurt yourself or others, or have thoughts about taking your own life, get help right away. You can go to your nearest emergency department or call:  Your local emergency services (911 in the [place]  A suicide crisis helpline, such as the [facility] at 9164775271. This is open 24 hours a day. Summary  If you have fatigue, you feel tired all the time and have a lack of energy or a lack of motivation.  Fatigue may make it difficult to start or complete tasks because of exhaustion.  Long-lasting (chronic) or extreme fatigue may be a symptom of a medical condition.  Exercise regularly, as told by your health care provider.  Change situations that cause you stress. Try to keep your  work and personal schedule in balance. This information is not intended to replace advice given to you by your health care provider. Make sure you discuss any questions you have with your health care provider. Document Revised: 09/08/2018 Document Reviewed: 11/12/2016 Elsevier Patient Education  2020 [place] 2-81 Years Old, Male Preventive care refers to lifestyle choices and visits with your health care provider that can promote health and  wellness. This includes:  A yearly physical exam. This is also called an annual well check.  Regular dental and eye exams.  Immunizations.  Screening for certain conditions.  Healthy lifestyle choices, such as eating a healthy diet, getting regular exercise, not using drugs or products that contain nicotine and tobacco, and limiting alcohol use. What can I expect for my preventive care visit? Physical exam Your health care provider will check:  Height and weight. These may be used to calculate body mass index (BMI), which is a measurement that tells if you are at a healthy weight.  Heart rate and blood pressure.  Your skin for abnormal spots. Counseling Your health care provider may ask you questions about:  Alcohol, tobacco, and drug use.  Emotional well-being.  Home and relationship well-being.  Sexual activity.  Eating habits.  Work and work Statistician. What immunizations do I need?  Influenza (flu) vaccine  This is recommended every year. Tetanus, diphtheria, and pertussis (Tdap) vaccine  You may need a Td booster every 10 years. Varicella (chickenpox) vaccine  You may need this vaccine if you have not already been vaccinated. Human papillomavirus (HPV) vaccine  If recommended by your health care provider, you may need three doses over 6 months. Measles, mumps, and rubella (MMR) vaccine  You may need at least one dose of MMR. You may also need a second dose. Meningococcal conjugate (MenACWY) vaccine  One dose is recommended if you are 77-29 years old and a Market researcher living in a residence hall, or if you have one of several medical conditions. You may also need additional booster doses. Pneumococcal conjugate (PCV13) vaccine  You may need this if you have certain conditions and were not previously vaccinated. Pneumococcal polysaccharide (PPSV23) vaccine  You may need one or two doses if you smoke cigarettes or if you have certain  conditions. Hepatitis A vaccine  You may need this if you have certain conditions or if you travel or work in places where you may be exposed to hepatitis A. Hepatitis B vaccine  You may need this if you have certain conditions or if you travel or work in places where you may be exposed to hepatitis B. Haemophilus influenzae type b (Hib) vaccine  You may need this if you have certain risk factors. You may receive vaccines as individual doses or as more than one vaccine together in one shot (combination vaccines). Talk with your health care provider about the risks and benefits of combination vaccines. What tests do I need? Blood tests  Lipid and cholesterol levels. These may be checked every 5 years starting at age 53.  Hepatitis C test.  Hepatitis B test. Screening   Diabetes screening. This is done by checking your blood sugar (glucose) after you have not eaten for a while (fasting).  Sexually transmitted disease (STD) testing. Talk with your health care provider about your test results, treatment options, and if necessary, the need for more tests. Follow these instructions at home: Eating and drinking   Eat a diet that includes fresh fruits and  vegetables, whole grains, lean protein, and low-fat dairy products.  Take vitamin and mineral supplements as recommended by your health care provider.  Do not drink alcohol if your health care provider tells you not to drink.  If you drink alcohol: ? Limit how much you have to 0-2 drinks a day. ? Be aware of how much alcohol is in your drink. In the [place] one drink equals one 12 oz bottle of beer (355 mL), one 5 oz glass of wine (148 mL), or one 1 oz glass of hard liquor (44 mL). Lifestyle  Take daily care of your teeth and gums.  Stay active. Exercise for at least 30 minutes on 5 or more days each week.  Do not use any products that contain nicotine or tobacco, such as cigarettes, e-cigarettes, and chewing tobacco. If you need  help quitting, ask your health care provider.  If you are sexually active, practice safe sex. Use a condom or other form of protection to prevent STIs (sexually transmitted infections). What's next?  Go to your health care provider once a year for a well check visit.  Ask your health care provider how often you should have your eyes and teeth checked.  Stay up to date on all vaccines. This information is not intended to replace advice given to you by your health care provider. Make sure you discuss any questions you have with your health care provider. Document Revised: 02/11/2018 Document Reviewed: 02/11/2018 Elsevier Patient Education  2020 [place] Maintenance, Male Adopting a healthy lifestyle and getting preventive care are important in promoting health and wellness. Ask your health care provider about:  The right schedule for you to have regular tests and exams.  Things you can do on your own to prevent diseases and keep yourself healthy. What should I know about diet, weight, and exercise? Eat a healthy diet   Eat a diet that includes plenty of vegetables, fruits, low-fat dairy products, and lean protein.  Do not eat a lot of foods that are high in solid fats, added sugars, or sodium. Maintain a healthy weight Body mass index (BMI) is a measurement that can be used to identify possible weight problems. It estimates body fat based on height and weight. Your health care provider can help determine your BMI and help you achieve or maintain a healthy weight. Get regular exercise Get regular exercise. This is one of the most important things you can do for your health. Most adults should:  Exercise for at least 150 minutes each week. The exercise should increase your heart rate and make you sweat (moderate-intensity exercise).  Do strengthening exercises at least twice a week. This is in addition to the moderate-intensity exercise.  Spend less time sitting. Even  light physical activity can be beneficial. Watch cholesterol and blood lipids Have your blood tested for lipids and cholesterol at 39 years of age, then have this test every 5 years. You may need to have your cholesterol levels checked more often if:  Your lipid or cholesterol levels are high.  You are older than 39 years of age.  You are at high risk for heart disease. What should I know about cancer screening? Many types of cancers can be detected early and may often be prevented. Depending on your health history and family history, you may need to have cancer screening at various ages. This may include screening for:  Colorectal cancer.  Prostate cancer.  Skin cancer.  Lung cancer. What should I  know about heart disease, diabetes, and high blood pressure? Blood pressure and heart disease  High blood pressure causes heart disease and increases the risk of stroke. This is more likely to develop in people who have high blood pressure readings, are of African descent, or are overweight.  Talk with your health care provider about your target blood pressure readings.  Have your blood pressure checked: ? Every 3-5 years if you are 21-95 years of age. ? Every year if you are 45 years old or older.  If you are between the ages of 59 and 35 and are a current or former smoker, ask your health care provider if you should have a one-time screening for abdominal aortic aneurysm (AAA). Diabetes Have regular diabetes screenings. This checks your fasting blood sugar level. Have the screening done:  Once every three years after age 44 if you are at a normal weight and have a low risk for diabetes.  More often and at a younger age if you are overweight or have a high risk for diabetes. What should I know about preventing infection? Hepatitis B If you have a higher risk for hepatitis B, you should be screened for this virus. Talk with your health care provider to find out if you are at risk  for hepatitis B infection. Hepatitis C Blood testing is recommended for:  Everyone born from [birth year] through 1965.  Anyone with known risk factors for hepatitis C. Sexually transmitted infections (STIs)  You should be screened each year for STIs, including gonorrhea and chlamydia, if: ? You are sexually active and are younger than 39 years of age. ? You are older than 39 years of age and your health care provider tells you that you are at risk for this type of infection. ? Your sexual activity has changed since you were last screened, and you are at increased risk for chlamydia or gonorrhea. Ask your health care provider if you are at risk.  Ask your health care provider about whether you are at high risk for HIV. Your health care provider may recommend a prescription medicine to help prevent HIV infection. If you choose to take medicine to prevent HIV, you should first get tested for HIV. You should then be tested every 3 months for as long as you are taking the medicine. Follow these instructions at home: Lifestyle  Do not use any products that contain nicotine or tobacco, such as cigarettes, e-cigarettes, and chewing tobacco. If you need help quitting, ask your health care provider.  Do not use street drugs.  Do not share needles.  Ask your health care provider for help if you need support or information about quitting drugs. Alcohol use  Do not drink alcohol if your health care provider tells you not to drink.  If you drink alcohol: ? Limit how much you have to 0-2 drinks a day. ? Be aware of how much alcohol is in your drink. In the [place] one drink equals one 12 oz bottle of beer (355 mL), one 5 oz glass of wine (148 mL), or one 1 oz glass of hard liquor (44 mL). General instructions  Schedule regular health, dental, and eye exams.  Stay current with your vaccines.  Tell your health care provider if: ? You often feel depressed. ? You have ever been abused or do not feel  safe at home. Summary  Adopting a healthy lifestyle and getting preventive care are important in promoting health and wellness.  Follow your health care  provider's instructions about healthy diet, exercising, and getting tested or screened for diseases.  Follow your health care provider's instructions on monitoring your cholesterol and blood pressure. This information is not intended to replace advice given to you by your health care provider. Make sure you discuss any questions you have with your health care provider. Document Revised: 02/10/2018 Document Reviewed: 02/10/2018 Elsevier Patient Education  2020 [employer].

## 2019-11-17 NOTE — Progress Notes (Signed)
New Patient Office Visit  Subjective:  Patient ID: Joshua Gregory, male    DOB: 05-13-1980  Age: 39 y.o. MRN: [id number]  CC:  Chief Complaint  Patient presents with  . Establish Care    new patient, no concerns. Fasting     HPI Joshua Gregory presents for a physical exam and evaluation of fatigue.  Patient is working and going to school.  He ended a 13-year relationship a year and half ago.  He has been in a new relationship over the last few months and seeing things seem to be going well with that.  He quit smoking this past month.  He is exercising regularly.  He does admit to sadness irritability worry and some deep decrease in focus.  Marijuana has helped in the past.  He is not currently using this or any other drug.  He rarely drinks alcohol.  He is vaping some after he quit smoking.  Status post left orchiectomy for testicular cancer in 2009.  He has done well since then.  His libido has been okay.  No issues with ED.  He is having some problems with his teeth and plans to see the dentist soon.  Past Medical History:  Diagnosis Date  . Cancer [facility])    testicular cancer     Past Surgical History:  Procedure Laterality Date  . SURGERY SCROTAL / TESTICULAR      Family History  Problem Relation Age of Onset  . Healthy Mother   . Healthy Father   . Healthy Maternal Grandmother     Social History   Socioeconomic History  . Marital status: Single    Spouse name: Not on file  . Number of children: Not on file  . Years of education: Not on file  . Highest education level: Not on file  Occupational History  . Not on file  Tobacco Use  . Smoking status: Former Smoker    Types: Cigarettes    Quit date: 10/16/2019    Years since quitting: 0.0  . Smokeless tobacco: Never Used  Vaping Use  . Vaping Use: Every day  Substance and Sexual Activity  . Alcohol use: Yes    Comment: rare  . Drug use: No  . Sexual activity: Yes  Other Topics Concern  . Not on file    Social History Narrative  . Not on file   Social Determinants of Health   Financial Resource Strain:   . Difficulty of Paying Living Expenses: Not on file  Food Insecurity:   . Worried About Charity fundraiser in the Last Year: Not on file  . Ran Out of Food in the Last Year: Not on file  Transportation Needs:   . Lack of Transportation (Medical): Not on file  . Lack of Transportation (Non-Medical): Not on file  Physical Activity:   . Days of Exercise per Week: Not on file  . Minutes of Exercise per Session: Not on file  Stress:   . Feeling of Stress : Not on file  Social Connections:   . Frequency of Communication with Friends and Family: Not on file  . Frequency of Social Gatherings with Friends and Family: Not on file  . Attends Religious Services: Not on file  . Active Member of Clubs or Organizations: Not on file  . Attends Archivist Meetings: Not on file  . Marital Status: Not on file  Intimate Partner Violence:   . Fear of Current or Ex-Partner: Not on file  .  Emotionally Abused: Not on file  . Physically Abused: Not on file  . Sexually Abused: Not on file    ROS Review of Systems  Constitutional: Positive for fatigue. Negative for chills, diaphoresis, fever and unexpected weight change.  HENT: Positive for dental problem.   Eyes: Negative for photophobia and visual disturbance.  Respiratory: Negative.   Cardiovascular: Negative.   Gastrointestinal: Negative.   Endocrine: Negative for polyphagia and polyuria.  Genitourinary: Negative.   Musculoskeletal: Negative for gait problem and joint swelling.  Skin: Negative for pallor and rash.  Allergic/Immunologic: Negative for immunocompromised state.  Neurological: Negative for light-headedness and numbness.  Hematological: Does not bruise/bleed easily.  Psychiatric/Behavioral: Negative.     Objective:   Today's Vitals: BP 132/78   Pulse (!) 58   Temp 97.6 F (36.4 C) (Tympanic)   Ht 5\' 6"  (1.676  m)   Wt 164 lb 9.6 oz (74.7 kg)   SpO2 98%   BMI 26.57 kg/m   Physical Exam Vitals and nursing note reviewed.  Constitutional:      General: He is not in acute distress.    Appearance: Normal appearance. He is normal weight. He is not ill-appearing, toxic-appearing or diaphoretic.  HENT:     Head: Normocephalic and atraumatic.     Right Ear: Tympanic membrane, ear canal and external ear normal.     Left Ear: Tympanic membrane, ear canal and external ear normal.     Mouth/Throat:     Mouth: Mucous membranes are moist.     Pharynx: Oropharynx is clear. No oropharyngeal exudate or posterior oropharyngeal erythema.  Eyes:     General: No scleral icterus.       Right eye: No discharge.        Left eye: No discharge.     Extraocular Movements: Extraocular movements intact.     Conjunctiva/sclera: Conjunctivae normal.     Pupils: Pupils are equal, round, and reactive to light.  Cardiovascular:     Rate and Rhythm: Normal rate and regular rhythm.  Pulmonary:     Effort: Pulmonary effort is normal.     Breath sounds: Normal breath sounds.  Abdominal:     General: Abdomen is flat. Bowel sounds are normal. There is no distension.     Palpations: There is no mass.     Tenderness: There is no abdominal tenderness. There is no guarding or rebound.     Hernia: No hernia is present. There is no hernia in the left inguinal area or right inguinal area.  Genitourinary:    Penis: No hypospadias, erythema, tenderness, discharge, swelling or lesions.      Testes:        Right: Mass, tenderness or swelling not present. Right testis is descended.        Left: Mass, tenderness or swelling not present. Left testis is descended.     Epididymis:     Right: Not inflamed or enlarged.     Left: Not inflamed or enlarged.    Musculoskeletal:     Cervical back: Neck supple. No rigidity or tenderness.     Right lower leg: No edema.     Left lower leg: No edema.  Lymphadenopathy:     Cervical: No  cervical adenopathy.     Lower Body: No right inguinal adenopathy. No left inguinal adenopathy.  Skin:    General: Skin is warm and dry.  Neurological:     Mental Status: He is alert and oriented to person, place, and time.  Psychiatric:        Mood and Affect: Mood normal.     Assessment & Plan:   Problem List Items Addressed This Visit      Other   History of testicular cancer   Relevant Orders   Testosterone   Fatigue   Relevant Orders   Testosterone   TSH   Healthcare maintenance - Primary   Relevant Orders   CBC   Comprehensive metabolic panel   HIV Antibody (routine testing w rflx)   Hepatitis C antibody   Lipid panel   Urinalysis, Routine w reflex microscopic   Dysthymia      Outpatient Encounter Medications as of 11/17/2019  Medication Sig  . [DISCONTINUED] HYDROcodone-acetaminophen (NORCO/VICODIN) 5-325 MG per tablet Take 1 tablet by mouth every 4 (four) hours as needed for moderate pain or severe pain. (Patient not taking: Reported on 11/17/2019)  . [DISCONTINUED] ibuprofen (ADVIL,MOTRIN) 800 MG tablet Take 1 tablet (800 mg total) by mouth every 8 (eight) hours as needed for mild pain or moderate pain. (Patient not taking: Reported on 11/17/2019)   No facility-administered encounter medications on file as of 11/17/2019.    Follow-up: Return in about 1 month (around 12/17/2019), or Follow up in a month to discuss possible treatment for sadness or dysthymia.. Consider Wellbutrin.  Discussed the Covid vaccine and recommend that he have 1.  Libby Maw, MD

## 2019-11-18 LAB — HEPATITIS C ANTIBODY
Hepatitis C Ab: NONREACTIVE
SIGNAL TO CUT-OFF: 0.1 (ref <1.00)

## 2019-11-18 LAB — HIV ANTIBODY (ROUTINE TESTING W REFLEX): HIV 1&2 Ab, 4th Generation: NONREACTIVE

## 2019-12-05 ENCOUNTER — Encounter: Payer: Self-pay | Admitting: Family Medicine

## 2019-12-05 ENCOUNTER — Ambulatory Visit (INDEPENDENT_AMBULATORY_CARE_PROVIDER_SITE_OTHER): Payer: Managed Care, Other (non HMO) | Admitting: Family Medicine

## 2019-12-05 ENCOUNTER — Other Ambulatory Visit: Payer: Self-pay

## 2019-12-05 VITALS — BP 122/68 | HR 73 | Temp 97.8°F | Ht 66.0 in | Wt 173.8 lb

## 2019-12-05 DIAGNOSIS — E781 Pure hyperglyceridemia: Secondary | ICD-10-CM

## 2019-12-05 DIAGNOSIS — E291 Testicular hypofunction: Secondary | ICD-10-CM

## 2019-12-05 DIAGNOSIS — R5383 Other fatigue: Secondary | ICD-10-CM

## 2019-12-05 MED ORDER — TESTOSTERONE CYPIONATE 200 MG/ML IM SOLN: 200.0000 mg | INTRAMUSCULAR | 0 refills | Status: DC

## 2019-12-05 NOTE — Progress Notes (Signed)
Established Patient Office Visit  Subjective:  Patient ID: Joshua Gregory, male    DOB: [birth year]  Age: 39 y.o. MRN: [id number]  CC:  Chief Complaint  Patient presents with  . Follow-up    discuss low testosterone labs done 11/17/2019    HPI Joshua Gregory presents for follow-up of androgen deficiency and fatigue.  He admits that there actually has been a decrease in libido.  No particular problem with ED but he is not waking up with an erection as he did 10 or 15 years ago.  He did quit smoking recently.  He continues to vape some.  He admits that he consumes fast food, and late for lunch.  He is status post left orchiectomy for testicular cancer in 29-May-2007.  He was pronounced cured.  He has had no further recurrences.  He is not currently a [employer] donor.  Past Medical History:  Diagnosis Date  . Cancer [facility])    testicular cancer     Past Surgical History:  Procedure Laterality Date  . SURGERY SCROTAL / TESTICULAR      Family History  Problem Relation Age of Onset  . Healthy Mother   . Healthy Father   . Healthy Maternal Grandmother     Social History   Socioeconomic History  . Marital status: Single    Spouse name: Not on file  . Number of children: Not on file  . Years of education: Not on file  . Highest education level: Not on file  Occupational History  . Not on file  Tobacco Use  . Smoking status: Former Smoker    Types: Cigarettes    Quit date: 10/16/2019    Years since quitting: 0.1  . Smokeless tobacco: Never Used  Vaping Use  . Vaping Use: Every day  Substance and Sexual Activity  . Alcohol use: Yes    Comment: rare  . Drug use: No  . Sexual activity: Yes  Other Topics Concern  . Not on file  Social History Narrative  . Not on file   Social Determinants of Health   Financial Resource Strain:   . Difficulty of Paying Living Expenses: Not on file  Food Insecurity:   . Worried About Charity fundraiser in the Last Year: Not on file   . Ran Out of Food in the Last Year: Not on file  Transportation Needs:   . Lack of Transportation (Medical): Not on file  . Lack of Transportation (Non-Medical): Not on file  Physical Activity:   . Days of Exercise per Week: Not on file  . Minutes of Exercise per Session: Not on file  Stress:   . Feeling of Stress : Not on file  Social Connections:   . Frequency of Communication with Friends and Family: Not on file  . Frequency of Social Gatherings with Friends and Family: Not on file  . Attends Religious Services: Not on file  . Active Member of Clubs or Organizations: Not on file  . Attends Archivist Meetings: Not on file  . Marital Status: Not on file  Intimate Partner Violence:   . Fear of Current or Ex-Partner: Not on file  . Emotionally Abused: Not on file  . Physically Abused: Not on file  . Sexually Abused: Not on file    No outpatient medications prior to visit.   No facility-administered medications prior to visit.    Allergies  Allergen Reactions  . Latex   . Shellfish Allergy  ROS Review of Systems  Constitutional: Negative.   HENT: Negative.   Eyes: Negative for photophobia and visual disturbance.  Respiratory: Negative.   Cardiovascular: Negative.   Gastrointestinal: Negative.   Endocrine: Negative for polyphagia and polyuria.  Genitourinary: Negative.   Musculoskeletal: Negative for gait problem and joint swelling.  Skin: Negative for pallor and rash.  Allergic/Immunologic: Negative for immunocompromised state.  Neurological: Negative for light-headedness and numbness.  Hematological: Does not bruise/bleed easily.  Psychiatric/Behavioral: Negative.       Objective:    Physical Exam Vitals and nursing note reviewed.  Constitutional:      Appearance: Normal appearance.  HENT:     Head: Normocephalic and atraumatic.     Right Ear: External ear normal.     Left Ear: External ear normal.  Eyes:     General:        Right eye:  No discharge.        Left eye: No discharge.     Conjunctiva/sclera: Conjunctivae normal.  Pulmonary:     Effort: Pulmonary effort is normal.  Skin:    General: Skin is warm and dry.  Neurological:     Mental Status: He is alert and oriented to person, place, and time.  Psychiatric:        Mood and Affect: Mood normal.        Behavior: Behavior normal.     BP 122/68   Pulse 73   Temp 97.8 F (36.6 C)   Ht 5\' 6"  (1.676 m)   Wt 173 lb 12.8 oz (78.8 kg)   SpO2 98%   BMI 28.05 kg/m  Wt Readings from Last 3 Encounters:  12/05/19 173 lb 12.8 oz (78.8 kg)  11/17/19 164 lb 9.6 oz (74.7 kg)     Health Maintenance Due  Topic Date Due  . COVID-19 Vaccine (1) Never done  . INFLUENZA VACCINE  Never done    There are no preventive care reminders to display for this patient.  Lab Results  Component Value Date   TSH 0.88 11/17/2019   Lab Results  Component Value Date   WBC 8.4 11/17/2019   HGB 15.8 11/17/2019   HCT 46.8 11/17/2019   MCV 86.8 11/17/2019   PLT 239.0 11/17/2019   Lab Results  Component Value Date   NA 136 11/17/2019   K 5.1 11/17/2019   CO2 29 11/17/2019   GLUCOSE 87 11/17/2019   BUN 14 11/17/2019   CREATININE 1.27 11/17/2019   BILITOT 0.7 11/17/2019   ALKPHOS 129 (H) 11/17/2019   AST 19 11/17/2019   ALT 26 11/17/2019   PROT 7.4 11/17/2019   ALBUMIN 4.7 11/17/2019   CALCIUM 10.1 11/17/2019   GFR 63.00 11/17/2019   Lab Results  Component Value Date   CHOL 198 11/17/2019   Lab Results  Component Value Date   HDL 42.80 11/17/2019   No results found for: [facility] Lab Results  Component Value Date   TRIG 246.0 (H) 11/17/2019   Lab Results  Component Value Date   CHOLHDL 5 11/17/2019   No results found for: HGBA1C    Assessment & Plan:   Problem List Items Addressed This Visit      Endocrine   Androgen deficiency   Relevant Medications   testosterone cypionate (DEPOTESTOSTERONE CYPIONATE) 200 MG/ML injection     Other   Fatigue -  Primary   Relevant Medications   testosterone cypionate (DEPOTESTOSTERONE CYPIONATE) 200 MG/ML injection   Hypertriglyceridemia      Meds  ordered this encounter  Medications  . testosterone cypionate (DEPOTESTOSTERONE CYPIONATE) 200 MG/ML injection    Sig: Inject 1 mL (200 mg total) into the muscle every 14 (fourteen) days.    Dispense:  10 mL    Refill:  0    Follow-up: Return in about 3 months (around 03/06/2020).  Will start testosterone cypionate 200 mg every 2 weeks.  His significant other or wife can give him the injections and rotating hip locations.  Am checking with his urologist to be certain that replacement therapy is safe.  We'll need to follow his hemoglobin levels.  He was given information on hyperlipidemia.  He will improve his dietary choices and follow back up in 3 months.  Libby Maw, MD

## 2019-12-05 NOTE — Patient Instructions (Signed)

## 2019-12-15 ENCOUNTER — Ambulatory Visit: Payer: Managed Care, Other (non HMO) | Admitting: Family Medicine

## 2020-03-08 ENCOUNTER — Ambulatory Visit: Payer: Managed Care, Other (non HMO) | Admitting: Family Medicine

## 2020-03-26 ENCOUNTER — Ambulatory Visit: Payer: Managed Care, Other (non HMO) | Admitting: Family Medicine

## 2020-04-05 ENCOUNTER — Ambulatory Visit: Payer: Managed Care, Other (non HMO) | Admitting: Family Medicine

## 2020-04-05 ENCOUNTER — Other Ambulatory Visit: Payer: Self-pay

## 2020-04-06 ENCOUNTER — Encounter: Payer: Self-pay | Admitting: Family Medicine

## 2020-04-06 ENCOUNTER — Ambulatory Visit (INDEPENDENT_AMBULATORY_CARE_PROVIDER_SITE_OTHER): Payer: Managed Care, Other (non HMO) | Admitting: Family Medicine

## 2020-04-06 VITALS — BP 128/86 | HR 89 | Temp 97.1°F | Ht 66.0 in | Wt 164.2 lb

## 2020-04-06 DIAGNOSIS — M2011 Hallux valgus (acquired), right foot: Secondary | ICD-10-CM | POA: Diagnosis not present

## 2020-04-06 DIAGNOSIS — E291 Testicular hypofunction: Secondary | ICD-10-CM

## 2020-04-06 LAB — CBC
HCT: 44.1 % (ref 39.0–52.0)
Hemoglobin: 14.6 g/dL (ref 13.0–17.0)
MCHC: 33.2 g/dL (ref 30.0–36.0)
MCV: 84.7 fl (ref 78.0–100.0)
Platelets: 262 10*3/uL (ref 150.0–400.0)
RBC: 5.21 Mil/uL (ref 4.22–5.81)
RDW: 13.1 % (ref 11.5–15.5)
WBC: 7.8 10*3/uL (ref 4.0–10.5)

## 2020-04-06 MED ORDER — TESTOSTERONE CYPIONATE 200 MG/ML IM SOLN: 200.0000 mg | INTRAMUSCULAR | 0 refills | Status: DC

## 2020-04-06 NOTE — Progress Notes (Signed)
  [facility] [address] [place] [address] Dept: [phone] Dept Fax: [phone]  Acute Office Visit  Subjective:    Patient ID: Joshua Gregory, male    DOB: [birth year], 40 y.o..   MRN: [id number]  Chief Complaint  Patient presents with  . Pain    Pain in right big toe x 1 month seems to come and go. Also need refill on Testosterone injection.     History of Present Illness:  Patient is in today complaining of a 1 month history of right foot pain over the 1st MTP joint. He does not recall any injury to the foot. He notes he does work standing on concrete floors and wears work boots. He has noticed that the 1st right toe is no longer straight inline. Someone told him about gout, so he was concerned that this might be what he is experiencing. He has no prior history of gout.  Joshua Gregory has a history of testicular cancer. He now has testosterone deficiency. He notes he has been off his injections for the past several weeks, as his prescription ran out. He did find that his energy level was much improved when he on testosterone.  Past Medical History: Patient Active Problem List   Diagnosis Date Noted  . Hallux valgus (acquired), right foot 04/06/2020  . Hypertriglyceridemia 12/05/2019  . Androgen deficiency 12/05/2019  . History of testicular cancer 11/17/2019  . Fatigue 11/17/2019  . Healthcare maintenance 11/17/2019  . Dysthymia 11/17/2019   Past Surgical History:  Procedure Laterality Date  . SURGERY SCROTAL / TESTICULAR     Outpatient Medications Prior to Visit  Medication Sig Dispense Refill  . testosterone cypionate (DEPOTESTOSTERONE CYPIONATE) 200 MG/ML injection Inject 1 mL (200 mg total) into the muscle every 14 (fourteen) days. 10 mL 0   No facility-administered medications prior to visit.   Allergies  Allergen Reactions  . Latex   . Shellfish Allergy      Objective:   Today's Vitals    04/06/20 1133  BP: 128/86  Pulse: 89  Temp: (!) 97.1 F (36.2 C)  TempSrc: Temporal  SpO2: 98%  Weight: 164 lb 3.2 oz (74.5 kg)  Height: 5\' 6"  (1.676 m)   Body mass index is 26.5 kg/m.   General: Well developed, well nourished. No acute distress. Extremities: The right foot shows mild valgus deformity of the 1st toe with a prominent 1st MTP joint. There is no   significant swelling or redness. This is nontender to touch our movement of the 1st toe.Back: Straight. No CVA   tenderness bilaterally. Psych: Alert and oriented. Normal mood and affect.  Health Maintenance Due  Topic Date Due  . INFLUENZA VACCINE  Never done      Assessment & Plan:   1. Hallux valgus (acquired), right foot There is mild hallux valgus. I recommended he use heat with ROM and ibuprofen for acute flares. If pain persists, he may need podiatry referral for orthotics or consideration for surgery.  2. Androgen deficiency On testosterone injections. Will check CBC to monitor for drug-related side effects. Follow-up with Dr. Ethelene Hal in 3 months.  - testosterone cypionate (DEPOTESTOSTERONE CYPIONATE) 200 MG/ML injection; Inject 1 mL (200 mg total) into the muscle every 14 (fourteen) days.  Dispense: 10 mL; Refill: 0 - CBC   Haydee Salter, MD

## 2020-04-07 ENCOUNTER — Other Ambulatory Visit: Payer: Self-pay | Admitting: Family Medicine

## 2020-04-07 DIAGNOSIS — E291 Testicular hypofunction: Secondary | ICD-10-CM

## 2020-04-11 ENCOUNTER — Encounter: Payer: Self-pay | Admitting: Family Medicine

## 2020-04-11 DIAGNOSIS — E291 Testicular hypofunction: Secondary | ICD-10-CM

## 2020-07-05 ENCOUNTER — Ambulatory Visit: Payer: Self-pay | Admitting: Family Medicine

## 2020-09-19 ENCOUNTER — Other Ambulatory Visit: Payer: Self-pay

## 2020-09-20 ENCOUNTER — Ambulatory Visit (INDEPENDENT_AMBULATORY_CARE_PROVIDER_SITE_OTHER): Payer: Commercial Managed Care - PPO | Admitting: Family Medicine

## 2020-09-20 ENCOUNTER — Encounter: Payer: Self-pay | Admitting: Family Medicine

## 2020-09-20 VITALS — BP 128/82 | HR 84 | Temp 97.3°F | Ht 66.0 in | Wt 169.4 lb

## 2020-09-20 DIAGNOSIS — E291 Testicular hypofunction: Secondary | ICD-10-CM | POA: Diagnosis not present

## 2020-09-20 DIAGNOSIS — Z0000 Encounter for general adult medical examination without abnormal findings: Secondary | ICD-10-CM | POA: Diagnosis not present

## 2020-09-20 DIAGNOSIS — E781 Pure hyperglyceridemia: Secondary | ICD-10-CM | POA: Diagnosis not present

## 2020-09-20 DIAGNOSIS — Q667 Congenital pes cavus, unspecified foot: Secondary | ICD-10-CM

## 2020-09-20 LAB — URINALYSIS, ROUTINE W REFLEX MICROSCOPIC
Bilirubin Urine: NEGATIVE
Hgb urine dipstick: NEGATIVE
Ketones, ur: NEGATIVE
Leukocytes,Ua: NEGATIVE
Nitrite: NEGATIVE
RBC / HPF: NONE SEEN (ref 0–?)
Specific Gravity, Urine: <=1.005 — AB (ref 1.000–1.030)
Total Protein, Urine: NEGATIVE
Urine Glucose: NEGATIVE
Urobilinogen, UA: 0.2 (ref 0.0–1.0)
pH: 6 (ref 5.0–8.0)

## 2020-09-20 LAB — COMPREHENSIVE METABOLIC PANEL
ALT: 24 U/L (ref 0–53)
AST: 24 U/L (ref 0–37)
Albumin: 4.5 g/dL (ref 3.5–5.2)
Alkaline Phosphatase: 104 U/L (ref 39–117)
BUN: 17 mg/dL (ref 6–23)
CO2: 30 mEq/L (ref 19–32)
Calcium: 9.8 mg/dL (ref 8.4–10.5)
Chloride: 101 mEq/L (ref 96–112)
Creatinine, Ser: 1.11 mg/dL (ref 0.40–1.50)
GFR: 83.22 mL/min (ref >60.00)
Glucose, Bld: 90 mg/dL (ref 70–99)
Potassium: 5.1 mEq/L (ref 3.5–5.1)
Sodium: 137 mEq/L (ref 135–145)
Total Bilirubin: 0.5 mg/dL (ref 0.2–1.2)
Total Protein: 7 g/dL (ref 6.0–8.3)

## 2020-09-20 LAB — LIPID PANEL
Cholesterol: 189 mg/dL (ref 0–200)
HDL: 37.2 mg/dL — ABNORMAL LOW (ref >39.00)
NonHDL: 152.19
Total CHOL/HDL Ratio: 5
Triglycerides: 213 mg/dL — ABNORMAL HIGH (ref 0.0–149.0)
VLDL: 42.6 mg/dL — ABNORMAL HIGH (ref 0.0–40.0)

## 2020-09-20 LAB — CBC
HCT: 45.1 % (ref 39.0–52.0)
Hemoglobin: 14.8 g/dL (ref 13.0–17.0)
MCHC: 32.8 g/dL (ref 30.0–36.0)
MCV: 86.6 fl (ref 78.0–100.0)
Platelets: 270 10*3/uL (ref 150.0–400.0)
RBC: 5.21 Mil/uL (ref 4.22–5.81)
RDW: 13.8 % (ref 11.5–15.5)
WBC: 7.5 10*3/uL (ref 4.0–10.5)

## 2020-09-20 LAB — LDL CHOLESTEROL, DIRECT: Direct LDL: 74 mg/dL

## 2020-09-20 LAB — URIC ACID: Uric Acid, Serum: 5.4 mg/dL (ref 4.0–7.8)

## 2020-09-20 MED ORDER — TESTOSTERONE CYPIONATE 200 MG/ML IM SOLN: INTRAMUSCULAR | 3 refills | Status: DC

## 2020-09-20 NOTE — Progress Notes (Signed)
Established Patient Office Visit  Subjective:  Patient ID: Joshua Gregory, male    DOB: Nov 29, 1980  Age: 40 y.o. MRN: [id number]  CC:  Chief Complaint  Patient presents with   Medication Refill    Refill/follow up on medication. No concerns.     HPI Joshua Gregory presents for a physical exam, follow-up of androgen deficiency, hypertriglyceridemia, right hallux deformity.  Significant other with medical training is administering his testosterone.  Does seem to be helping with energy levels.  Admits to enjoying high-fat foods.  He does work in work boots on concrete floor for up to 12-hour shifts.  Recently started back to the gym working out.  Past Medical History:  Diagnosis Date   Cancer [facility])    testicular cancer     Past Surgical History:  Procedure Laterality Date   SURGERY SCROTAL / TESTICULAR      Family History  Problem Relation Age of Onset   Healthy Mother    Healthy Father    Healthy Maternal Grandmother     Social History   Socioeconomic History   Marital status: Single    Spouse name: Not on file   Number of children: Not on file   Years of education: Not on file   Highest education level: Not on file  Occupational History   Not on file  Tobacco Use   Smoking status: Former    Types: Cigarettes    Quit date: 10/16/2019    Years since quitting: 0.9   Smokeless tobacco: Never  Vaping Use   Vaping Use: Every day  Substance and Sexual Activity   Alcohol use: Yes    Comment: rare   Drug use: No   Sexual activity: Yes  Other Topics Concern   Not on file  Social History Narrative   Not on file   Social Determinants of Health   Financial Resource Strain: Not on file  Food Insecurity: Not on file  Transportation Needs: Not on file  Physical Activity: Not on file  Stress: Not on file  Social Connections: Not on file  Intimate Partner Violence: Not on file    Outpatient Medications Prior to Visit  Medication Sig Dispense Refill    testosterone cypionate (DEPOTESTOSTERONE CYPIONATE) 200 MG/ML injection INJECT 1ML INTRAMUSCULARLY EVERY 14 DAYSAS DIRECTED 10 mL 0   No facility-administered medications prior to visit.    Allergies  Allergen Reactions   Latex    Shellfish Allergy     ROS Review of Systems  Constitutional: Negative.   HENT: Negative.    Eyes:  Negative for photophobia and visual disturbance.  Respiratory: Negative.    Cardiovascular: Negative.   Gastrointestinal: Negative.   Genitourinary: Negative.   Musculoskeletal:  Positive for arthralgias. Negative for gait problem.  Neurological:  Negative for speech difficulty and weakness.  Psychiatric/Behavioral: Negative.       Objective:    Physical Exam Vitals and nursing note reviewed.  Constitutional:      General: He is not in acute distress.    Appearance: Normal appearance. He is normal weight. He is not ill-appearing, toxic-appearing or diaphoretic.  HENT:     Head: Normocephalic and atraumatic.     Right Ear: Tympanic membrane, ear canal and external ear normal.     Left Ear: Tympanic membrane, ear canal and external ear normal.     Mouth/Throat:     Mouth: Mucous membranes are dry.     Pharynx: Oropharynx is clear. No oropharyngeal exudate or posterior oropharyngeal erythema.  Eyes:     General: No scleral icterus.       Right eye: No discharge.        Left eye: No discharge.     Extraocular Movements: Extraocular movements intact.     Conjunctiva/sclera: Conjunctivae normal.     Pupils: Pupils are equal, round, and reactive to light.  Neck:     Vascular: No carotid bruit.  Cardiovascular:     Rate and Rhythm: Normal rate and regular rhythm.  Pulmonary:     Effort: Pulmonary effort is normal.     Breath sounds: Normal breath sounds.  Abdominal:     General: Abdomen is flat. Bowel sounds are normal. There is no distension.     Palpations: Abdomen is soft. There is no mass.     Tenderness: There is no abdominal tenderness.  There is no guarding or rebound.     Hernia: No hernia is present.  Musculoskeletal:     Cervical back: No rigidity or tenderness.       Legs:  Lymphadenopathy:     Cervical: No cervical adenopathy.  Skin:    General: Skin is warm and dry.  Neurological:     Mental Status: He is alert and oriented to person, place, and time.  Psychiatric:        Mood and Affect: Mood normal.        Behavior: Behavior normal.    BP 128/82   Pulse 84   Temp (!) 97.3 F (36.3 C) (Temporal)   Ht 5\' 6"  (1.676 m)   Wt 169 lb 6.4 oz (76.8 kg)   SpO2 98%   BMI 27.34 kg/m  Wt Readings from Last 3 Encounters:  09/20/20 169 lb 6.4 oz (76.8 kg)  04/06/20 164 lb 3.2 oz (74.5 kg)  12/05/19 173 lb 12.8 oz (78.8 kg)     Health Maintenance Due  Topic Date Due   COVID-19 Vaccine (2 - Booster for Janssen series) 04/13/2020    There are no preventive care reminders to display for this patient.  Lab Results  Component Value Date   TSH 0.88 11/17/2019   Lab Results  Component Value Date   WBC 7.8 04/06/2020   HGB 14.6 04/06/2020   HCT 44.1 04/06/2020   MCV 84.7 04/06/2020   PLT 262.0 04/06/2020   Lab Results  Component Value Date   NA 136 11/17/2019   K 5.1 11/17/2019   CO2 29 11/17/2019   GLUCOSE 87 11/17/2019   BUN 14 11/17/2019   CREATININE 1.27 11/17/2019   BILITOT 0.7 11/17/2019   ALKPHOS 129 (H) 11/17/2019   AST 19 11/17/2019   ALT 26 11/17/2019   PROT 7.4 11/17/2019   ALBUMIN 4.7 11/17/2019   CALCIUM 10.1 11/17/2019   GFR 63.00 11/17/2019   Lab Results  Component Value Date   CHOL 198 11/17/2019   Lab Results  Component Value Date   HDL 42.80 11/17/2019   No results found for: [facility] Lab Results  Component Value Date   TRIG 246.0 (H) 11/17/2019   Lab Results  Component Value Date   CHOLHDL 5 11/17/2019   No results found for: HGBA1C    Assessment & Plan:   Problem List Items Addressed This Visit       Endocrine   Androgen deficiency   Relevant  Medications   testosterone cypionate (DEPOTESTOSTERONE CYPIONATE) 200 MG/ML injection   Other Relevant Orders   Testosterone Total,Free,Bio, Males-([facility])   CBC     Other   Healthcare  maintenance - Primary   Relevant Orders   CBC   Comprehensive metabolic panel   Urinalysis, Routine w reflex microscopic   Uric acid   Hypertriglyceridemia   Relevant Orders   Lipid panel   Cavus deformity of foot    Meds ordered this encounter  Medications   testosterone cypionate (DEPOTESTOSTERONE CYPIONATE) 200 MG/ML injection    Sig: INJECT 1ML INTRAMUSCULARLY EVERY 14 DAYSAS DIRECTED    Dispense:  10 mL    Refill:  3    Follow-up: Return in about 6 months (around 03/23/2021).  Encouraged patient to hydrate well.  Given information on health maintenance and disease prevention.  Also encouraged to lower the fat and cholesterol in his diet.  He was given information on dyslipidemia.  Given information on cavus foot.  Advised him to consider inserts with arch support as well as heel and toe cushioning.  Libby Maw, MD

## 2020-09-21 LAB — TESTOSTERONE TOTAL,FREE,BIO, MALES
Albumin: 4.5 g/dL (ref 3.6–5.1)
Sex Hormone Binding: 16 nmol/L (ref 10–50)
Testosterone, Bioavailable: 407.3 ng/dL (ref 110.0–575.0)
Testosterone, Free: 198 pg/mL (ref 46.0–224.0)
Testosterone: 783 ng/dL (ref 250–827)

## 2021-03-25 ENCOUNTER — Ambulatory Visit: Payer: Self-pay | Admitting: Family Medicine

## 2021-03-25 ENCOUNTER — Telehealth: Payer: Self-pay | Admitting: Family Medicine

## 2021-03-25 NOTE — Telephone Encounter (Signed)
Pt did not show up for his 03/25/21 app.

## 2021-03-28 ENCOUNTER — Encounter: Payer: Self-pay | Admitting: Family Medicine

## 2021-03-28 NOTE — Telephone Encounter (Signed)
1st "no show" & 2 prior late arrivals - sending letter that additional late/no show will be dismissal - charge generated for $50

## 2021-04-11 ENCOUNTER — Other Ambulatory Visit: Payer: Self-pay | Admitting: Family Medicine

## 2021-04-11 DIAGNOSIS — E291 Testicular hypofunction: Secondary | ICD-10-CM

## 2021-04-11 MED ORDER — TESTOSTERONE CYPIONATE 200 MG/ML IM SOLN: INTRAMUSCULAR | 3 refills | Status: DC

## 2021-04-11 NOTE — Telephone Encounter (Signed)
Refill request for pending Rx last refill and OV 09/20/20. Please advise.

## 2021-04-11 NOTE — Telephone Encounter (Signed)
Pt called and scheduled his follow up for 05/07/21 because he should have his new insurance by then. He wanted to know if he can get a refill on his testosterone until then. Please advise. Callback number is [phone]

## 2021-04-16 NOTE — Telephone Encounter (Signed)
Refill sen in

## 2021-05-06 ENCOUNTER — Ambulatory Visit: Payer: Self-pay | Admitting: Family Medicine

## 2021-05-07 ENCOUNTER — Ambulatory Visit (INDEPENDENT_AMBULATORY_CARE_PROVIDER_SITE_OTHER): Payer: 59 | Admitting: Family Medicine

## 2021-05-07 ENCOUNTER — Encounter: Payer: Self-pay | Admitting: Family Medicine

## 2021-05-07 ENCOUNTER — Other Ambulatory Visit: Payer: Self-pay

## 2021-05-07 VITALS — BP 136/88 | HR 81 | Temp 97.7°F | Ht 66.0 in | Wt 170.2 lb

## 2021-05-07 DIAGNOSIS — E291 Testicular hypofunction: Secondary | ICD-10-CM

## 2021-05-07 DIAGNOSIS — E781 Pure hyperglyceridemia: Secondary | ICD-10-CM

## 2021-05-07 DIAGNOSIS — Z5181 Encounter for therapeutic drug level monitoring: Secondary | ICD-10-CM

## 2021-05-07 LAB — CBC
HCT: 42.8 % (ref 39.0–52.0)
Hemoglobin: 14.5 g/dL (ref 13.0–17.0)
MCHC: 33.9 g/dL (ref 30.0–36.0)
MCV: 85.7 fl (ref 78.0–100.0)
Platelets: 244 10*3/uL (ref 150.0–400.0)
RBC: 5 Mil/uL (ref 4.22–5.81)
RDW: 12.8 % (ref 11.5–15.5)
WBC: 8.3 10*3/uL (ref 4.0–10.5)

## 2021-05-07 LAB — LDL CHOLESTEROL, DIRECT: Direct LDL: 60 mg/dL

## 2021-05-07 LAB — LIPID PANEL
Cholesterol: 174 mg/dL (ref 0–200)
HDL: 34.3 mg/dL — ABNORMAL LOW (ref >39.00)
NonHDL: 140.1
Total CHOL/HDL Ratio: 5
Triglycerides: 379 mg/dL — ABNORMAL HIGH (ref 0.0–149.0)
VLDL: 75.8 mg/dL — ABNORMAL HIGH (ref 0.0–40.0)

## 2021-05-07 MED ORDER — TESTOSTERONE CYPIONATE 200 MG/ML IM SOLN: INTRAMUSCULAR | 2 refills | Status: DC

## 2021-05-07 NOTE — Progress Notes (Signed)
? ?Established Patient Office Visit ? ?Subjective:  ?Patient ID: Joshua Gregory, male    DOB: 1980-11-18  Age: 41 y.o. MRN: [id number] ? ?CC:  ?Chief Complaint  ?Patient presents with  ?? Follow-up  ?  Routine follow up, no concerns. Patient fasting.  ? ? ?HPI ?Joshua Gregory presents for follow-up of dyslipidemia and androgen deficiency.  Continues with biweekly injections.  Has helped with energy levels and libido.  He is almost 6 hours fasting.  Works third shift for [employer]. ? ?Past Medical History:  ?Diagnosis Date  ?? Cancer [facility])   ? testicular cancer   ? ? ?Past Surgical History:  ?Procedure Laterality Date  ?? SURGERY SCROTAL / TESTICULAR    ? ? ?Family History  ?Problem Relation Age of Onset  ?? Healthy Mother   ?? Healthy Father   ?? Healthy Maternal Grandmother   ? ? ?Social History  ? ?Socioeconomic History  ?? Marital status: Single  ?  Spouse name: Not on file  ?? Number of children: Not on file  ?? Years of education: Not on file  ?? Highest education level: Not on file  ?Occupational History  ?? Not on file  ?Tobacco Use  ?? Smoking status: Former  ?  Types: Cigarettes  ?  Quit date: 10/16/2019  ?  Years since quitting: 1.5  ?? Smokeless tobacco: Never  ?Vaping Use  ?? Vaping Use: Every day  ?Substance and Sexual Activity  ?? Alcohol use: Yes  ?  Comment: rare  ?? Drug use: No  ?? Sexual activity: Yes  ?Other Topics Concern  ?? Not on file  ?Social History Narrative  ?? Not on file  ? ?Social Determinants of Health  ? ?Financial Resource Strain: Not on file  ?Food Insecurity: Not on file  ?Transportation Needs: Not on file  ?Physical Activity: Not on file  ?Stress: Not on file  ?Social Connections: Not on file  ?Intimate Partner Violence: Not on file  ? ? ?Outpatient Medications Prior to Visit  ?Medication Sig Dispense Refill  ?? testosterone cypionate (DEPOTESTOSTERONE CYPIONATE) 200 MG/ML injection INJECT 1ML INTRAMUSCULARLY EVERY 14 DAYSAS DIRECTED 10 mL 3  ? ?No  facility-administered medications prior to visit.  ? ? ?Allergies  ?Allergen Reactions  ?? Latex   ?? Shellfish Allergy   ? ? ?ROS ?Review of Systems  ?Constitutional:  Negative for chills, diaphoresis, fatigue, fever and unexpected weight change.  ?Respiratory: Negative.    ?Cardiovascular: Negative.   ?Gastrointestinal: Negative.   ?Genitourinary: Negative.   ?Neurological:  Negative for speech difficulty, weakness and light-headedness.  ?Psychiatric/Behavioral: Negative.    ? ?  ?Objective:  ?  ?Physical Exam ?Vitals and nursing note reviewed.  ?Constitutional:   ?   General: He is not in acute distress. ?   Appearance: Normal appearance. He is not ill-appearing, toxic-appearing or diaphoretic.  ?HENT:  ?   Head: Normocephalic and atraumatic.  ?   Right Ear: External ear normal.  ?   Left Ear: External ear normal.  ?Eyes:  ?   General: No scleral icterus.    ?   Right eye: No discharge.     ?   Left eye: No discharge.  ?   Extraocular Movements: Extraocular movements intact.  ?   Conjunctiva/sclera: Conjunctivae normal.  ?Cardiovascular:  ?   Rate and Rhythm: Normal rate and regular rhythm.  ?Pulmonary:  ?   Effort: Pulmonary effort is normal.  ?   Breath sounds: Normal breath sounds.  ?Abdominal:  ?  General: Bowel sounds are normal.  ?Skin: ?   General: Skin is warm and dry.  ?Neurological:  ?   Mental Status: He is alert and oriented to person, place, and time.  ?Psychiatric:     ?   Mood and Affect: Mood normal.     ?   Behavior: Behavior normal.  ? ? ?BP 136/88 (BP Location: Left Arm, Patient Position: Sitting, Cuff Size: Large)   Pulse 81   Temp 97.7 ?F (36.5 ?C) (Temporal)   Ht '5\' 6"'$  (1.676 m)   Wt 170 lb 3.2 oz (77.2 kg)   SpO2 98%   BMI 27.47 kg/m?  ?Wt Readings from Last 3 Encounters:  ?05/07/21 170 lb 3.2 oz (77.2 kg)  ?09/20/20 169 lb 6.4 oz (76.8 kg)  ?04/06/20 164 lb 3.2 oz (74.5 kg)  ? ? ? ?There are no preventive care reminders to display for this patient. ? ?There are no preventive care  reminders to display for this patient. ? ?Lab Results  ?Component Value Date  ? TSH 0.88 11/17/2019  ? ?Lab Results  ?Component Value Date  ? WBC 7.5 09/20/2020  ? HGB 14.8 09/20/2020  ? HCT 45.1 09/20/2020  ? MCV 86.6 09/20/2020  ? PLT 270.0 09/20/2020  ? ?Lab Results  ?Component Value Date  ? NA 137 09/20/2020  ? K 5.1 09/20/2020  ? CO2 30 09/20/2020  ? GLUCOSE 90 09/20/2020  ? BUN 17 09/20/2020  ? CREATININE 1.11 09/20/2020  ? BILITOT 0.5 09/20/2020  ? ALKPHOS 104 09/20/2020  ? AST 24 09/20/2020  ? ALT 24 09/20/2020  ? PROT 7.0 09/20/2020  ? ALBUMIN 4.5 09/20/2020  ? CALCIUM 9.8 09/20/2020  ? GFR 83.22 09/20/2020  ? ?Lab Results  ?Component Value Date  ? CHOL 189 09/20/2020  ? ?Lab Results  ?Component Value Date  ? HDL 37.20 (L) 09/20/2020  ? ?No results found for: [place] ?Lab Results  ?Component Value Date  ? TRIG 213.0 (H) 09/20/2020  ? ?Lab Results  ?Component Value Date  ? CHOLHDL 5 09/20/2020  ? ?No results found for: HGBA1C ? ?  ?Assessment & Plan:  ? ?Problem List Items Addressed This Visit   ? ?  ? Endocrine  ? Androgen deficiency  ? Relevant Medications  ? testosterone cypionate (DEPOTESTOSTERONE CYPIONATE) 200 MG/ML injection  ?  ? Other  ? Hypertriglyceridemia - Primary  ? Relevant Orders  ? Lipid panel  ? ?Other Visit Diagnoses   ? ? Medication monitoring encounter      ? Relevant Orders  ? CBC  ? ?  ? ? ?Meds ordered this encounter  ?Medications  ?? testosterone cypionate (DEPOTESTOSTERONE CYPIONATE) 200 MG/ML injection  ?  Sig: INJECT 1ML INTRAMUSCULARLY EVERY 14 DAYSAS DIRECTED  ?  Dispense:  10 mL  ?  Refill:  2  ?  Needs appropriate needles and syringes.  ? ? ?Follow-up: Return in about 1 year (around 05/08/2022), or if symptoms worsen or fail to improve.  ?Information was given on dyslipidemia and preventing high cholesterol. ? ?Libby Maw, MD ?

## 2022-03-08 ENCOUNTER — Other Ambulatory Visit: Payer: Self-pay | Admitting: Family Medicine

## 2022-03-08 DIAGNOSIS — E291 Testicular hypofunction: Secondary | ICD-10-CM

## 2022-05-09 ENCOUNTER — Ambulatory Visit: Payer: BC Managed Care – PPO | Admitting: Family Medicine

## 2022-05-09 ENCOUNTER — Encounter: Payer: Self-pay | Admitting: Family Medicine

## 2022-05-09 VITALS — BP 132/84 | HR 78 | Temp 98.1°F | Wt 171.0 lb

## 2022-05-09 DIAGNOSIS — E781 Pure hyperglyceridemia: Secondary | ICD-10-CM

## 2022-05-09 DIAGNOSIS — E291 Testicular hypofunction: Secondary | ICD-10-CM

## 2022-05-09 DIAGNOSIS — Z0000 Encounter for general adult medical examination without abnormal findings: Secondary | ICD-10-CM | POA: Diagnosis not present

## 2022-05-09 DIAGNOSIS — Z5181 Encounter for therapeutic drug level monitoring: Secondary | ICD-10-CM | POA: Diagnosis not present

## 2022-05-09 NOTE — Progress Notes (Signed)
Established Patient Office Visit   Subjective:  Patient ID: Joshua Gregory, male    DOB: Jul 17, 1980  Age: 42 y.o. MRN: [id number]  Chief Complaint  Patient presents with   Medical Management of Chronic Issues    HPI Encounter Diagnoses  Name Primary?   Healthcare maintenance Yes   Androgen deficiency    Medication monitoring encounter    Hypertriglyceridemia    For physical exam and follow-up of above.  Nonfasting this morning.  Doing okay with the testosterone injections.  He has access to dental care.  He is not exercising outside of work.  He is unaware of a family history of elevated triglycerides.  Things are well at home     Review of Systems  Constitutional: Negative.   HENT: Negative.    Eyes:  Negative for blurred vision, discharge and redness.  Respiratory: Negative.    Cardiovascular: Negative.   Gastrointestinal:  Negative for abdominal pain.  Genitourinary: Negative.   Musculoskeletal: Negative.  Negative for myalgias.  Skin:  Negative for rash.  Neurological:  Negative for tingling, loss of consciousness and weakness.  Endo/Heme/Allergies:  Negative for polydipsia.      05/09/2022    8:26 AM 05/07/2021    8:04 AM 09/20/2020   10:57 AM  Depression screen PHQ 2/9  Decreased Interest 0 0 0  Down, Depressed, Hopeless 0 0 0  PHQ - 2 Score 0 0 0  Altered sleeping 0    Tired, decreased energy 1    Change in appetite 0    Feeling bad or failure about yourself  0    Trouble concentrating 0    Moving slowly or fidgety/restless 0    Suicidal thoughts 0    PHQ-9 Score 1    Difficult doing work/chores Not difficult at all         Current Outpatient Medications:    testosterone cypionate (DEPOTESTOSTERONE CYPIONATE) 200 MG/ML injection, INJECT 1 ML INTRAMUSCULARLY EVERY 14 DAYS, Disp: 10 mL, Rfl: 1   Objective:     BP 132/84 (BP Location: Left Arm, Patient Position: Sitting, Cuff Size: Large)   Pulse 78   Temp 98.1 F (36.7 C) (Temporal)   Wt 171  lb (77.6 kg)   SpO2 98%   BMI 27.60 kg/m    Physical Exam Constitutional:      General: He is not in acute distress.    Appearance: Normal appearance. He is not ill-appearing, toxic-appearing or diaphoretic.  HENT:     Head: Normocephalic and atraumatic.     Right Ear: External ear normal.     Left Ear: External ear normal.     Mouth/Throat:     Mouth: Mucous membranes are moist.     Pharynx: Oropharynx is clear. No oropharyngeal exudate or posterior oropharyngeal erythema.  Eyes:     General: No scleral icterus.       Right eye: No discharge.        Left eye: No discharge.     Extraocular Movements: Extraocular movements intact.     Conjunctiva/sclera: Conjunctivae normal.     Pupils: Pupils are equal, round, and reactive to light.  Cardiovascular:     Rate and Rhythm: Normal rate and regular rhythm.  Pulmonary:     Effort: Pulmonary effort is normal. No respiratory distress.     Breath sounds: Normal breath sounds.  Abdominal:     General: Bowel sounds are normal. There is no distension.     Tenderness: There is no abdominal tenderness.  There is no guarding or rebound.     Hernia: No hernia is present. There is no hernia in the left inguinal area or right inguinal area.  Genitourinary:    Penis: Circumcised. No hypospadias, erythema, tenderness, discharge, swelling or lesions.      Testes:        Right: Mass, tenderness or swelling not present. Right testis is descended.     Epididymis:     Right: Not inflamed or enlarged. No mass.     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 Musculoskeletal:     Cervical back: No rigidity or tenderness.  Lymphadenopathy:     Cervical: No cervical adenopathy.     Lower Body: No right inguinal adenopathy. No left inguinal adenopathy.  Skin:    General: Skin is warm and dry.  Neurological:     Mental Status: He is alert and oriented to person, place, and time.  Psychiatric:        Mood and Affect:  Mood normal.        Behavior: Behavior normal.      No results found for any visits on 05/09/22.    The 10-year ASCVD risk score (Arnett DK, et al., 2019) is: 5.5%    Assessment & Plan:   Healthcare maintenance -     Hemoglobin A1c; Future -     Urinalysis, Routine w reflex microscopic; Future  Androgen deficiency -     CBC; Future  Medication monitoring encounter -     CBC; Future  Hypertriglyceridemia -     Comprehensive metabolic panel; Future -     Lipid panel; Future    Return in about 1 year (around 05/09/2023), or Return fasting for ordered blood work..  Information was given on health maintenance and disease prevention.  Encouraged him to exercise for at least 30 minutes daily outside of work.  Information was also given on preventing high cholesterol in the Mediterranean diet.  Advised that weight loss can also help his triglyceride levels.  Libby Maw, MD

## 2022-05-15 ENCOUNTER — Other Ambulatory Visit (INDEPENDENT_AMBULATORY_CARE_PROVIDER_SITE_OTHER): Payer: BC Managed Care – PPO

## 2022-05-15 DIAGNOSIS — E781 Pure hyperglyceridemia: Secondary | ICD-10-CM

## 2022-05-15 DIAGNOSIS — Z0000 Encounter for general adult medical examination without abnormal findings: Secondary | ICD-10-CM | POA: Diagnosis not present

## 2022-05-15 DIAGNOSIS — E291 Testicular hypofunction: Secondary | ICD-10-CM

## 2022-05-15 DIAGNOSIS — Z5181 Encounter for therapeutic drug level monitoring: Secondary | ICD-10-CM

## 2022-05-15 LAB — LIPID PANEL
Cholesterol: 145 mg/dL (ref 0–200)
HDL: 32.5 mg/dL — ABNORMAL LOW (ref >39.00)
LDL Cholesterol: 85 mg/dL (ref 0–99)
NonHDL: 112.92
Total CHOL/HDL Ratio: 4
Triglycerides: 138 mg/dL (ref 0.0–149.0)
VLDL: 27.6 mg/dL (ref 0.0–40.0)

## 2022-05-15 LAB — URINALYSIS, ROUTINE W REFLEX MICROSCOPIC
Bilirubin Urine: NEGATIVE
Hgb urine dipstick: NEGATIVE
Ketones, ur: NEGATIVE
Leukocytes,Ua: NEGATIVE
Nitrite: NEGATIVE
RBC / HPF: NONE SEEN (ref 0–?)
Specific Gravity, Urine: 1.025 (ref 1.000–1.030)
Total Protein, Urine: NEGATIVE
Urine Glucose: NEGATIVE
Urobilinogen, UA: 0.2 (ref 0.0–1.0)
pH: 6 (ref 5.0–8.0)

## 2022-05-15 LAB — HEMOGLOBIN A1C: Hgb A1c MFr Bld: 5.7 % (ref 4.6–6.5)

## 2022-05-15 LAB — CBC
HCT: 49 % (ref 39.0–52.0)
Hemoglobin: 16.6 g/dL (ref 13.0–17.0)
MCHC: 33.9 g/dL (ref 30.0–36.0)
MCV: 86.2 fl (ref 78.0–100.0)
Platelets: 264 10*3/uL (ref 150.0–400.0)
RBC: 5.68 Mil/uL (ref 4.22–5.81)
RDW: 13.4 % (ref 11.5–15.5)
WBC: 7.7 10*3/uL (ref 4.0–10.5)

## 2022-05-15 LAB — COMPREHENSIVE METABOLIC PANEL
ALT: 18 U/L (ref 0–53)
AST: 12 U/L (ref 0–37)
Albumin: 4 g/dL (ref 3.5–5.2)
Alkaline Phosphatase: 114 U/L (ref 39–117)
BUN: 19 mg/dL (ref 6–23)
CO2: 29 mEq/L (ref 19–32)
Calcium: 9.1 mg/dL (ref 8.4–10.5)
Chloride: 100 mEq/L (ref 96–112)
Creatinine, Ser: 1.14 mg/dL (ref 0.40–1.50)
GFR: 79.67 mL/min (ref >60.00)
Glucose, Bld: 102 mg/dL — ABNORMAL HIGH (ref 70–99)
Potassium: 3.9 mEq/L (ref 3.5–5.1)
Sodium: 137 mEq/L (ref 135–145)
Total Bilirubin: 0.5 mg/dL (ref 0.2–1.2)
Total Protein: 6.6 g/dL (ref 6.0–8.3)

## 2022-05-15 NOTE — Progress Notes (Signed)
Pt is here for labs   

## 2022-11-24 ENCOUNTER — Telehealth: Payer: Self-pay | Admitting: Family Medicine

## 2022-11-24 DIAGNOSIS — E291 Testicular hypofunction: Secondary | ICD-10-CM

## 2022-11-24 NOTE — Telephone Encounter (Signed)
Prescription Request  11/24/2022  LOV: 05/09/2022  What is the name of the medication or equipment? testosterone cypionate (DEPOTESTOSTERONE CYPIONATE) 200 MG/ML injection [[id number]]   Have you contacted your pharmacy to request a refill? No   Which pharmacy would you like this sent to?  [facility] - [place] - [address] [place] [address] Phone: [phone] Fax: [phone]    Patient notified that their request is being sent to the clinical staff for review and that they should receive a response within 2 business days.   Please advise at Mobile [phone] (mobile)

## 2022-11-25 MED ORDER — TESTOSTERONE CYPIONATE 200 MG/ML IM SOLN: INTRAMUSCULAR | 1 refills | Status: DC

## 2022-12-05 ENCOUNTER — Telehealth: Payer: Self-pay | Admitting: Family Medicine

## 2022-12-05 DIAGNOSIS — E291 Testicular hypofunction: Secondary | ICD-10-CM

## 2022-12-05 MED ORDER — TESTOSTERONE CYPIONATE 200 MG/ML IM SOLN: INTRAMUSCULAR | 1 refills | Status: AC

## 2022-12-05 NOTE — Telephone Encounter (Signed)
Pt want to know can you transfer this prescription to a different pharmacy that have this medication in stock because his regular pharmacy said it was on back order but this pharmacy has it -  [address] [place], [place] [address]  24 mi [phone]

## 2024-01-14 ENCOUNTER — Encounter: Payer: Self-pay | Admitting: Family Medicine

## 2024-01-14 ENCOUNTER — Ambulatory Visit: Admitting: Family Medicine

## 2024-01-14 VITALS — BP 140/100 | HR 127 | Temp 98.1°F | Ht 66.0 in | Wt 169.2 lb

## 2024-01-14 DIAGNOSIS — E291 Testicular hypofunction: Secondary | ICD-10-CM

## 2024-01-14 DIAGNOSIS — R030 Elevated blood-pressure reading, without diagnosis of hypertension: Secondary | ICD-10-CM | POA: Diagnosis not present

## 2024-01-14 DIAGNOSIS — F1090 Alcohol use, unspecified, uncomplicated: Secondary | ICD-10-CM | POA: Diagnosis not present

## 2024-01-14 DIAGNOSIS — R000 Tachycardia, unspecified: Secondary | ICD-10-CM

## 2024-01-14 NOTE — Progress Notes (Addendum)
 "  Established Patient Office Visit   Subjective:  Patient ID: Joshua Gregory, male    DOB: September 24, 1980  Age: 43 y.o. MRN: [id number]  Chief Complaint  Patient presents with   Medication Check     Pt presents today for a med check for testosterone  cypionate. Patient states its been going good, no specific questions or concerns     HPI Encounter Diagnoses  Name Primary?   Androgen deficiency Yes   Elevated BP reading w/ no diagnosis of HTN    Elevated pulse rate    Alcohol use    For follow-up of androgen deficiency.  Was last seen in March 2024 and asked to return in 1 year.  He has been out of his testosterone .  Last dose was a month ago he believes.  He does not smoke.  2-3 servings of alcohol daily.  Denies illicit drug use.   Review of Systems  Constitutional: Negative.   HENT: Negative.    Eyes:  Negative for blurred vision, discharge and redness.  Respiratory: Negative.  Negative for shortness of breath.   Cardiovascular: Negative.  Negative for chest pain.  Gastrointestinal:  Negative for abdominal pain.  Genitourinary: Negative.   Musculoskeletal: Negative.  Negative for myalgias.  Skin:  Negative for rash.  Neurological:  Negative for tingling, loss of consciousness and weakness.  Endo/Heme/Allergies:  Negative for polydipsia.     Current Outpatient Medications:    testosterone  cypionate (DEPOTESTOSTERONE CYPIONATE) 200 MG/ML injection, INJECT 1 ML INTRAMUSCULARLY EVERY 14 DAYS, Disp: 10 mL, Rfl: 1   Objective:     BP (!) 140/100   Pulse (!) 127   Temp 98.1 F (36.7 C)   Ht 5' 6 (1.676 m)   Wt 169 lb 3.2 oz (76.7 kg)   SpO2 97%   BMI 27.31 kg/m  BP Readings from Last 3 Encounters:  01/14/24 (!) 140/100  05/09/22 132/84  05/07/21 136/88   Wt Readings from Last 3 Encounters:  01/14/24 169 lb 3.2 oz (76.7 kg)  05/09/22 171 lb (77.6 kg)  05/07/21 170 lb 3.2 oz (77.2 kg)      Physical Exam Constitutional:      General: He is not in acute  distress.    Appearance: Normal appearance. He is not ill-appearing, toxic-appearing or diaphoretic.  HENT:     Head: Normocephalic and atraumatic.     Right Ear: External ear normal.     Left Ear: External ear normal.     Mouth/Throat:     Mouth: Mucous membranes are moist.     Pharynx: Oropharynx is clear. No oropharyngeal exudate or posterior oropharyngeal erythema.  Eyes:     General: No scleral icterus.       Right eye: No discharge.        Left eye: No discharge.     Extraocular Movements: Extraocular movements intact.     Conjunctiva/sclera: Conjunctivae normal.     Pupils: Pupils are equal, round, and reactive to light.  Cardiovascular:     Rate and Rhythm: Regular rhythm. Tachycardia present.  Pulmonary:     Effort: Pulmonary effort is normal. No respiratory distress.     Breath sounds: Normal breath sounds. No wheezing or rales.  Abdominal:     General: Bowel sounds are normal.  Musculoskeletal:     Cervical back: No rigidity or tenderness.  Skin:    General: Skin is warm and dry.  Neurological:     Mental Status: He is alert and oriented to person, place,  and time.  Psychiatric:        Behavior: Behavior is agitated (Mildly agitated).   What do   No results found for any visits on 01/14/24.    The 10-year ASCVD risk score (Arnett DK, et al., 2019) is: 5.4%    Assessment & Plan:   Androgen deficiency -     CBC; Future -     Phosphatidylethanol (PEth); Future -     TSH; Future  Elevated BP reading w/ no diagnosis of HTN -     EKG 12-Lead -     Comprehensive metabolic panel with GFR; Future -     Urine drugs of abuse scrn w alc, routine (Ref Lab); Future  Elevated pulse rate -     EKG 12-Lead -     Comprehensive metabolic panel with GFR; Future -     TSH; Future -     Urine drugs of abuse scrn w alc, routine (Ref Lab); Future  Alcohol use -     Phosphatidylethanol (PEth); Future    Return in about 6 weeks (around 02/25/2024), or Need lab  results before filling testosterone ..  EKG shows sinus rhythm with a pulse rate of 113.  Elsie Sim Lent, MD  12/30 addendum: Recently returned for blood work and among other results of concern was his low TSH level.  And concerned about hyperthyroidism.  Denied his refill request for testosterone  refill.  Will need to RTC before refilling. "

## 2024-01-18 ENCOUNTER — Other Ambulatory Visit

## 2024-02-22 ENCOUNTER — Telehealth: Payer: Self-pay | Admitting: Family Medicine

## 2024-02-22 ENCOUNTER — Other Ambulatory Visit

## 2024-02-22 DIAGNOSIS — R000 Tachycardia, unspecified: Secondary | ICD-10-CM

## 2024-02-22 DIAGNOSIS — E291 Testicular hypofunction: Secondary | ICD-10-CM

## 2024-02-22 DIAGNOSIS — R030 Elevated blood-pressure reading, without diagnosis of hypertension: Secondary | ICD-10-CM | POA: Diagnosis not present

## 2024-02-22 DIAGNOSIS — F1090 Alcohol use, unspecified, uncomplicated: Secondary | ICD-10-CM

## 2024-02-23 LAB — URINE DRUGS OF ABUSE SCREEN W ALC, ROUTINE (REF LAB)
Amphetamines, Urine: NEGATIVE ng/mL
Barbiturate Quant, Ur: NEGATIVE ng/mL
Benzodiazepine Quant, Ur: NEGATIVE ng/mL
Cannabinoid Quant, Ur: NEGATIVE ng/mL
Cocaine (Metab.): NEGATIVE ng/mL
Creatinine, Urine: 115 mg/dL (ref 20.0–300.0)
Ethanol, Urine: NEGATIVE %
Methadone Screen, Urine: NEGATIVE ng/mL
Nitrite Urine, Quantitative: NEGATIVE ug/mL
OPIATE SCREEN URINE: NEGATIVE ng/mL
PCP Quant, Ur: NEGATIVE ng/mL
Propoxyphene: NEGATIVE ng/mL
pH, Urine: 6.2 (ref 4.5–8.9)

## 2024-02-23 LAB — COMPREHENSIVE METABOLIC PANEL WITH GFR
ALT: 26 U/L (ref 3–53)
AST: 19 U/L (ref 5–37)
Albumin: 4.4 g/dL (ref 3.5–5.2)
Alkaline Phosphatase: 106 U/L (ref 39–117)
BUN: 17 mg/dL (ref 6–23)
CO2: 28 meq/L (ref 19–32)
Calcium: 9.5 mg/dL (ref 8.4–10.5)
Chloride: 101 meq/L (ref 96–112)
Creatinine, Ser: 1.2 mg/dL (ref 0.40–1.50)
GFR: 73.98 mL/min
Glucose, Bld: 95 mg/dL (ref 70–99)
Potassium: 3.9 meq/L (ref 3.5–5.1)
Sodium: 138 meq/L (ref 135–145)
Total Bilirubin: 0.3 mg/dL (ref 0.2–1.2)
Total Protein: 6.9 g/dL (ref 6.0–8.3)

## 2024-02-23 LAB — TSH: TSH: 0.32 u[IU]/mL — ABNORMAL LOW (ref 0.35–5.50)

## 2024-02-23 LAB — CBC
HCT: 42.1 % (ref 39.0–52.0)
Hemoglobin: 14.4 g/dL (ref 13.0–17.0)
MCHC: 34.1 g/dL (ref 30.0–36.0)
MCV: 84.8 fl (ref 78.0–100.0)
Platelets: 296 K/uL (ref 150.0–400.0)
RBC: 4.96 Mil/uL (ref 4.22–5.81)
RDW: 13.3 % (ref 11.5–15.5)
WBC: 7.2 K/uL (ref 4.0–10.5)

## 2024-02-24 ENCOUNTER — Other Ambulatory Visit: Payer: Self-pay | Admitting: Family Medicine

## 2024-02-24 DIAGNOSIS — E291 Testicular hypofunction: Secondary | ICD-10-CM

## 2024-02-24 NOTE — Telephone Encounter (Signed)
 Error

## 2024-02-24 NOTE — Telephone Encounter (Signed)
 Copied from CRM #[id number]. Topic: Clinical - Medication Refill >> Feb 24, 2024 10:45 AM Shereese L wrote: Medication: testosterone  cypionate (DEPOTESTOSTERONE CYPIONATE) 200 MG/ML injection  Has the patient contacted their pharmacy? Yes (Agent: If no, request that the patient contact the pharmacy for the refill. If patient does not wish to contact the pharmacy document the reason why and proceed with request.) (Agent: If yes, when and what did the pharmacy advise?)  This is the patient's preferred pharmacy:    [id number] [place], [place] - [address] AT [facility] [address] [place] [address] Phone: [phone] Fax: [phone]  Is this the correct pharmacy for this prescription? Yes If no, delete pharmacy and type the correct one.   Has the prescription been filled recently? Yes  Is the patient out of the medication? Yes  Has the patient been seen for an appointment in the last year OR does the patient have an upcoming appointment? Yes  Can we respond through MyChart? Yes  Agent: Please be advised that Rx refills may take up to 3 business days. We ask that you follow-up with your pharmacy.

## 2024-02-24 NOTE — Telephone Encounter (Signed)
 Requesting: testosterone  cypionate (DEPOTESTOSTERONE CYPIONATE) 200 MG/ML injection  Last Visit: 01/14/2024 Next Visit: 03/01/2024 Last Refill: 12/05/2022  Please Advise

## 2024-02-27 LAB — PHOSPHATIDYLETHANOL (PETH)
Phosphatidylethanol (PEth): 161 ng/mL
Phosphatidylethanol: POSITIVE — AB

## 2024-03-01 ENCOUNTER — Ambulatory Visit: Admitting: Family Medicine

## 2024-03-01 ENCOUNTER — Ambulatory Visit: Payer: Self-pay | Admitting: Family Medicine

## 2024-03-01 NOTE — Telephone Encounter (Signed)
 Patient seen in office on 03/01/24 by Dr. Berneta.

## 2024-03-01 NOTE — Progress Notes (Signed)
 Patient's TSH is low.  With his elevated pulse rate I am concerned about hyper thyroidism.  He missed today's appointment.  He needs to return to clinic to see me.  I have denied his request for a refill of his testosterone .

## 2024-03-02 NOTE — Telephone Encounter (Signed)
 ATC Patient to inform him of the reason of his rx denial. Unable to reach patient or lvm because it is not set up as of yet. Will attempt to call back later.

## 2024-03-02 NOTE — Telephone Encounter (Signed)
 Copied from CRM #[id number]. Topic: Clinical - Prescription Issue >> Feb 29, 2024  2:59 PM Alfonso ORN wrote: Reason for CRM: pt called to f/u on rx request for Testerone . He is unable to make tomorrow's appt but advised that he completed required labs . Please contact pt to update

## 2024-04-21 ENCOUNTER — Ambulatory Visit: Admitting: Family Medicine
